# Patient Record
Sex: Male | Born: 1975
Health system: Southern US, Community
[De-identification: ages and names within clinical notes are randomized; demographics above are authoritative.]

## PROBLEM LIST (undated history)

## (undated) DIAGNOSIS — I499 Cardiac arrhythmia, unspecified: Secondary | ICD-10-CM

## (undated) DIAGNOSIS — M303 Mucocutaneous lymph node syndrome [Kawasaki]: Secondary | ICD-10-CM

## (undated) DIAGNOSIS — I10 Essential (primary) hypertension: Secondary | ICD-10-CM

## (undated) HISTORY — DX: Essential (primary) hypertension: I10

## (undated) HISTORY — DX: Mucocutaneous lymph node syndrome (kawasaki): M30.3

---

## 2000-01-27 ENCOUNTER — Emergency Department (HOSPITAL_COMMUNITY): Admission: EM | Admit: 2000-01-27 | Discharge: 2000-01-27 | Payer: Self-pay | Admitting: Emergency Medicine

## 2000-03-07 ENCOUNTER — Encounter: Admission: RE | Admit: 2000-03-07 | Discharge: 2000-03-07 | Payer: Self-pay | Admitting: Internal Medicine

## 2000-12-09 ENCOUNTER — Emergency Department (HOSPITAL_COMMUNITY): Admission: EM | Admit: 2000-12-09 | Discharge: 2000-12-09 | Payer: Self-pay | Admitting: Emergency Medicine

## 2001-02-17 ENCOUNTER — Emergency Department (HOSPITAL_COMMUNITY): Admission: EM | Admit: 2001-02-17 | Discharge: 2001-02-18 | Payer: Self-pay | Admitting: Emergency Medicine

## 2001-02-18 ENCOUNTER — Encounter: Payer: Self-pay | Admitting: Emergency Medicine

## 2002-02-17 ENCOUNTER — Emergency Department (HOSPITAL_COMMUNITY): Admission: EM | Admit: 2002-02-17 | Discharge: 2002-02-17 | Payer: Self-pay | Admitting: Emergency Medicine

## 2002-02-17 ENCOUNTER — Encounter: Payer: Self-pay | Admitting: Emergency Medicine

## 2002-07-03 ENCOUNTER — Emergency Department (HOSPITAL_COMMUNITY): Admission: EM | Admit: 2002-07-03 | Discharge: 2002-07-03 | Payer: Self-pay | Admitting: Psychology

## 2002-07-03 ENCOUNTER — Encounter: Payer: Self-pay | Admitting: Emergency Medicine

## 2003-03-25 ENCOUNTER — Emergency Department (HOSPITAL_COMMUNITY): Admission: EM | Admit: 2003-03-25 | Discharge: 2003-03-25 | Payer: Self-pay | Admitting: Emergency Medicine

## 2003-12-23 ENCOUNTER — Inpatient Hospital Stay (HOSPITAL_COMMUNITY): Admission: EM | Admit: 2003-12-23 | Discharge: 2003-12-23 | Payer: Self-pay | Admitting: Emergency Medicine

## 2004-05-12 ENCOUNTER — Emergency Department (HOSPITAL_COMMUNITY): Admission: EM | Admit: 2004-05-12 | Discharge: 2004-05-12 | Payer: Self-pay | Admitting: Emergency Medicine

## 2005-01-03 ENCOUNTER — Emergency Department (HOSPITAL_COMMUNITY): Admission: EM | Admit: 2005-01-03 | Discharge: 2005-01-03 | Payer: Self-pay | Admitting: Emergency Medicine

## 2005-06-10 ENCOUNTER — Emergency Department (HOSPITAL_COMMUNITY): Admission: EM | Admit: 2005-06-10 | Discharge: 2005-06-10 | Payer: Self-pay | Admitting: Emergency Medicine

## 2005-11-05 ENCOUNTER — Emergency Department (HOSPITAL_COMMUNITY): Admission: EM | Admit: 2005-11-05 | Discharge: 2005-11-05 | Payer: Self-pay | Admitting: Emergency Medicine

## 2006-11-10 ENCOUNTER — Emergency Department (HOSPITAL_COMMUNITY): Admission: EM | Admit: 2006-11-10 | Discharge: 2006-11-10 | Payer: Self-pay | Admitting: Emergency Medicine

## 2007-07-01 ENCOUNTER — Emergency Department (HOSPITAL_COMMUNITY): Admission: EM | Admit: 2007-07-01 | Discharge: 2007-07-01 | Payer: Self-pay | Admitting: Emergency Medicine

## 2007-11-15 ENCOUNTER — Emergency Department (HOSPITAL_COMMUNITY): Admission: EM | Admit: 2007-11-15 | Discharge: 2007-11-16 | Payer: Self-pay | Admitting: Psychiatry

## 2008-05-24 ENCOUNTER — Emergency Department (HOSPITAL_COMMUNITY): Admission: EM | Admit: 2008-05-24 | Discharge: 2008-05-24 | Payer: Self-pay | Admitting: Emergency Medicine

## 2008-11-12 ENCOUNTER — Emergency Department (HOSPITAL_COMMUNITY): Admission: EM | Admit: 2008-11-12 | Discharge: 2008-11-12 | Payer: Self-pay | Admitting: Emergency Medicine

## 2009-04-04 ENCOUNTER — Emergency Department (HOSPITAL_COMMUNITY): Admission: EM | Admit: 2009-04-04 | Discharge: 2009-04-04 | Payer: Self-pay | Admitting: Emergency Medicine

## 2009-11-09 ENCOUNTER — Emergency Department (HOSPITAL_COMMUNITY): Admission: EM | Admit: 2009-11-09 | Discharge: 2009-11-09 | Payer: Self-pay | Admitting: Emergency Medicine

## 2010-01-17 ENCOUNTER — Emergency Department (HOSPITAL_COMMUNITY): Admission: EM | Admit: 2010-01-17 | Discharge: 2010-01-17 | Payer: Self-pay | Admitting: Emergency Medicine

## 2010-06-22 LAB — CBC
Hemoglobin: 14.7 g/dL (ref 13.0–17.0)
MCHC: 33.5 g/dL (ref 30.0–36.0)
MCV: 93.8 fL (ref 78.0–100.0)
RDW: 14.1 % (ref 11.5–15.5)

## 2010-06-22 LAB — DIFFERENTIAL
Basophils Absolute: 0.1 10*3/uL (ref 0.0–0.1)
Basophils Relative: 1 % (ref 0–1)
Eosinophils Absolute: 0.3 10*3/uL (ref 0.0–0.7)
Monocytes Absolute: 0.5 10*3/uL (ref 0.1–1.0)
Neutro Abs: 3.8 10*3/uL (ref 1.7–7.7)
Neutrophils Relative %: 50 % (ref 43–77)

## 2010-07-07 ENCOUNTER — Institutional Professional Consult (permissible substitution): Payer: Self-pay | Admitting: Pulmonary Disease

## 2010-08-20 ENCOUNTER — Emergency Department (HOSPITAL_COMMUNITY)
Admission: EM | Admit: 2010-08-20 | Discharge: 2010-08-20 | Disposition: A | Discharge status: Home / Self Care | Payer: BC Managed Care – PPO | Attending: Emergency Medicine | Admitting: Emergency Medicine

## 2010-08-20 ENCOUNTER — Emergency Department (HOSPITAL_COMMUNITY): Payer: BC Managed Care – PPO

## 2010-08-20 DIAGNOSIS — IMO0002 Reserved for concepts with insufficient information to code with codable children: Secondary | ICD-10-CM | POA: Insufficient documentation

## 2010-08-20 DIAGNOSIS — M79609 Pain in unspecified limb: Secondary | ICD-10-CM | POA: Insufficient documentation

## 2010-08-20 DIAGNOSIS — S6990XA Unspecified injury of unspecified wrist, hand and finger(s), initial encounter: Secondary | ICD-10-CM | POA: Insufficient documentation

## 2010-11-17 ENCOUNTER — Emergency Department (HOSPITAL_COMMUNITY)
Admission: EM | Admit: 2010-11-17 | Discharge: 2010-11-17 | Disposition: A | Discharge status: Home / Self Care | Payer: Self-pay | Attending: Emergency Medicine | Admitting: Emergency Medicine

## 2010-11-17 DIAGNOSIS — Z7982 Long term (current) use of aspirin: Secondary | ICD-10-CM | POA: Insufficient documentation

## 2010-11-17 DIAGNOSIS — I499 Cardiac arrhythmia, unspecified: Secondary | ICD-10-CM | POA: Insufficient documentation

## 2010-11-17 DIAGNOSIS — T1590XA Foreign body on external eye, part unspecified, unspecified eye, initial encounter: Secondary | ICD-10-CM | POA: Insufficient documentation

## 2010-11-21 ENCOUNTER — Emergency Department (HOSPITAL_COMMUNITY)
Admission: EM | Admit: 2010-11-21 | Discharge: 2010-11-21 | Disposition: A | Discharge status: Home / Self Care | Payer: Self-pay | Attending: Emergency Medicine | Admitting: Emergency Medicine

## 2010-11-21 DIAGNOSIS — H5789 Other specified disorders of eye and adnexa: Secondary | ICD-10-CM | POA: Insufficient documentation

## 2010-11-21 DIAGNOSIS — H5710 Ocular pain, unspecified eye: Secondary | ICD-10-CM | POA: Insufficient documentation

## 2010-11-27 ENCOUNTER — Emergency Department (HOSPITAL_COMMUNITY)
Admission: EM | Admit: 2010-11-27 | Discharge: 2010-11-27 | Disposition: A | Discharge status: Home / Self Care | Payer: BC Managed Care – PPO | Attending: Emergency Medicine | Admitting: Emergency Medicine

## 2010-11-27 DIAGNOSIS — H5710 Ocular pain, unspecified eye: Secondary | ICD-10-CM | POA: Insufficient documentation

## 2010-12-13 LAB — CBC
MCHC: 33
MCV: 94.4
Platelets: 262
RBC: 4.22

## 2010-12-13 LAB — POCT I-STAT, CHEM 8
Calcium, Ion: 1.22
Chloride: 105
Creatinine, Ser: 1.5
Potassium: 3.7
Sodium: 140
TCO2: 26

## 2010-12-13 LAB — DIFFERENTIAL
Basophils Absolute: 0
Basophils Relative: 0
Eosinophils Absolute: 0.4
Monocytes Relative: 9
Neutro Abs: 3.3
Neutrophils Relative %: 46

## 2010-12-13 LAB — POCT CARDIAC MARKERS
CKMB, poc: 1.7
Troponin i, poc: <0.05

## 2011-06-24 ENCOUNTER — Emergency Department (HOSPITAL_COMMUNITY)
Admission: EM | Admit: 2011-06-24 | Discharge: 2011-06-24 | Disposition: A | Discharge status: Home / Self Care | Payer: Self-pay | Attending: Emergency Medicine | Admitting: Emergency Medicine

## 2011-06-24 ENCOUNTER — Emergency Department (HOSPITAL_COMMUNITY): Payer: Self-pay

## 2011-06-24 ENCOUNTER — Encounter (HOSPITAL_COMMUNITY): Payer: Self-pay | Admitting: Emergency Medicine

## 2011-06-24 DIAGNOSIS — J45909 Unspecified asthma, uncomplicated: Secondary | ICD-10-CM | POA: Insufficient documentation

## 2011-06-24 DIAGNOSIS — X500XXA Overexertion from strenuous movement or load, initial encounter: Secondary | ICD-10-CM | POA: Insufficient documentation

## 2011-06-24 DIAGNOSIS — IMO0002 Reserved for concepts with insufficient information to code with codable children: Secondary | ICD-10-CM | POA: Insufficient documentation

## 2011-06-24 DIAGNOSIS — Y93K1 Activity, walking an animal: Secondary | ICD-10-CM | POA: Insufficient documentation

## 2011-06-24 DIAGNOSIS — S76319A Strain of muscle, fascia and tendon of the posterior muscle group at thigh level, unspecified thigh, initial encounter: Secondary | ICD-10-CM

## 2011-06-24 DIAGNOSIS — M25569 Pain in unspecified knee: Secondary | ICD-10-CM | POA: Insufficient documentation

## 2011-06-24 DIAGNOSIS — I10 Essential (primary) hypertension: Secondary | ICD-10-CM | POA: Insufficient documentation

## 2011-06-24 HISTORY — DX: Cardiac arrhythmia, unspecified: I49.9

## 2011-06-24 LAB — POCT I-STAT, CHEM 8
Calcium, Ion: 1.17 mmol/L (ref 1.12–1.32)
Chloride: 101 mEq/L (ref 96–112)
HCT: 45 % (ref 39.0–52.0)
Potassium: 3.7 mEq/L (ref 3.5–5.1)

## 2011-06-24 MED ORDER — KETOROLAC TROMETHAMINE 30 MG/ML IJ SOLN
30.0000 mg | Freq: Once | INTRAMUSCULAR | Status: AC
  Administered 2011-06-24: 30 mg via INTRAMUSCULAR
  Filled 2011-06-24: qty 1

## 2011-06-24 MED ORDER — AMLODIPINE BESYLATE 5 MG PO TABS
5.0000 mg | ORAL_TABLET | Freq: Once | ORAL | Status: AC
  Administered 2011-06-24: 5 mg via ORAL
  Filled 2011-06-24: qty 1

## 2011-06-24 MED ORDER — AMLODIPINE BESYLATE 10 MG PO TABS: 5.0000 mg | ORAL_TABLET | Freq: Every day | ORAL | Status: DC

## 2011-06-24 MED ORDER — HYDROCODONE-ACETAMINOPHEN 5-325 MG PO TABS: 2.0000 | ORAL_TABLET | ORAL | Status: AC | PRN

## 2011-06-24 NOTE — ED Provider Notes (Signed)
History     CSN: [id number]  Arrival date & time 06/24/11  0103   First MD Initiated Contact with Patient 06/24/11 0242      Chief Complaint  Patient presents with  . Knee Pain    R knee pain     HPI  Hx provided by pt.  Pt is a 36 yo male with hx of asthma who presents with complaints of rt knee injury and pain.  Pt reports taking his dogs out for a walk and coming down the steps on porch and missing a step causing him to twist and strain his rt knee yesterday.  Pt states he felt there was no significant injury and continued with normal activity. This morning pt woke up with increased pains to knee especially to posterior aspect.  Pain radiates some to calf and hamstring area.  Pain is worse with walking and bending.  Pt has taken some Ibuprofen without significant relief.  He denies other aggravating or alleviating factors.  Pain is moderate.  Pt denies any numbness, weakness or tingling in leg or foot.  He denies significant swelling but feels there is some swelling to the knee and leg.    Past Medical History  Diagnosis Date  . Asthma   . Irregular heartbeat     History reviewed. No pertinent past surgical history.  No family history on file.  History  Substance Use Topics  . Smoking status: Former Games developer  . Smokeless tobacco: Not on file  . Alcohol Use: No      Review of Systems  Respiratory: Negative for cough and shortness of breath.   Cardiovascular: Negative for chest pain.  Musculoskeletal: Positive for joint swelling. Negative for back pain.  Neurological: Negative for weakness and numbness.    Allergies  Review of patient's allergies indicates no known allergies.  Home Medications   Current Outpatient Rx  Name Route Sig Dispense Refill  . ASPIRIN EC 81 MG PO TBEC Oral Take 81 mg by mouth daily.      BP 188/112  Pulse 95  Temp(Src) 98.9 F (37.2 C) (Oral)  Resp 18  Ht 6' (1.829 m)  Wt 240 lb (108.863 kg)  BMI 32.55 kg/m2  SpO2 98%  Physical  Exam  Nursing note and vitals reviewed. Constitutional: He is oriented to person, place, and time. He appears well-developed and well-nourished. No distress.  HENT:  Head: Normocephalic and atraumatic.  Cardiovascular: Normal rate and regular rhythm.   No murmur heard. Pulmonary/Chest: Effort normal and breath sounds normal. No respiratory distress. He has no wheezes. He has no rales.  Abdominal: Soft. He exhibits no distension. There is no tenderness.  Musculoskeletal: He exhibits tenderness. He exhibits no edema.       No increased laxity with valgus or varus stress. Negative anterior posterior drawer test. Tenderness to palpation over medial  Neurological: He is alert and oriented to person, place, and time.  Skin: Skin is warm. No erythema.  Psychiatric: He has a normal mood and affect. His behavior is normal.    ED Course  Procedures   Results for orders placed during the hospital encounter of 06/24/11  POCT I-STAT, CHEM 8      Component Value Range   Sodium 138  135 - 145 (mEq/L)   Potassium 3.7  3.5 - 5.1 (mEq/L)   Chloride 101  96 - 112 (mEq/L)   BUN 24 (*) 6 - 23 (mg/dL)   Creatinine, Ser 7.82 (*) 0.50 - 1.35 (mg/dL)  Glucose, Bld 115 (*) 70 - 99 (mg/dL)   Calcium, Ion 1.61  0.96 - 1.32 (mmol/L)   TCO2 26  0 - 100 (mmol/L)   Hemoglobin 15.3  13.0 - 17.0 (g/dL)   HCT 04.5  40.9 - 81.1 (%)      Dg Knee Complete 4 Views Right  06/24/2011  *RADIOLOGY REPORT*  Clinical Data: Fall.  Knee injury and pain.  RIGHT KNEE - COMPLETE 4+ VIEW  Comparison:  None.  Findings:  There is no evidence of fracture, dislocation, or joint effusion.  There is no evidence of arthropathy or other focal bone abnormality.  Soft tissues are unremarkable.  IMPRESSION: Negative.  Original Report Authenticated By: Danae Orleans, M.D.     1. Hamstring muscle strain   2. Knee pain   3. Hypertension       MDM  3:50AM Pt seen and evaluated.  Pt in no acute distress.  Pt discussed with  attending physician.  Pt with HTN will start him on norvasc.  Pt without other concerning symptoms.  BP improved with medication and pain treatment.      Angus Seller, [place] 06/24/11 1711

## 2011-06-24 NOTE — ED Notes (Signed)
R knee pain, twisted early yesterday morning walking down stairs. Pt states worse after waking up in the morning, denies relief from ibuprofen, ice and elevation.

## 2011-06-24 NOTE — ED Notes (Signed)
Pt states while taking dog outside at 3am yesterday, missed step injuring R knee, pain worse this evening with burning sensation from thigh to calf

## 2011-06-24 NOTE — ED Provider Notes (Signed)
Medical screening examination/treatment/procedure(s) were performed by non-physician practitioner and as supervising physician I was immediately available for consultation/collaboration.  Mallori Araque K Mercedes Valeriano-Rasch, MD 06/24/11 2318 

## 2012-07-21 ENCOUNTER — Emergency Department (HOSPITAL_COMMUNITY): Payer: Self-pay

## 2012-07-21 ENCOUNTER — Encounter (HOSPITAL_COMMUNITY): Payer: Self-pay | Admitting: Emergency Medicine

## 2012-07-21 ENCOUNTER — Emergency Department (HOSPITAL_COMMUNITY)
Admission: EM | Admit: 2012-07-21 | Discharge: 2012-07-21 | Disposition: A | Discharge status: Home / Self Care | Payer: Self-pay | Attending: Emergency Medicine | Admitting: Emergency Medicine

## 2012-07-21 DIAGNOSIS — R059 Cough, unspecified: Secondary | ICD-10-CM | POA: Insufficient documentation

## 2012-07-21 DIAGNOSIS — J3489 Other specified disorders of nose and nasal sinuses: Secondary | ICD-10-CM | POA: Insufficient documentation

## 2012-07-21 DIAGNOSIS — Z7982 Long term (current) use of aspirin: Secondary | ICD-10-CM | POA: Insufficient documentation

## 2012-07-21 DIAGNOSIS — IMO0002 Reserved for concepts with insufficient information to code with codable children: Secondary | ICD-10-CM | POA: Insufficient documentation

## 2012-07-21 DIAGNOSIS — J45901 Unspecified asthma with (acute) exacerbation: Secondary | ICD-10-CM | POA: Insufficient documentation

## 2012-07-21 DIAGNOSIS — J45909 Unspecified asthma, uncomplicated: Secondary | ICD-10-CM

## 2012-07-21 DIAGNOSIS — R6883 Chills (without fever): Secondary | ICD-10-CM | POA: Insufficient documentation

## 2012-07-21 DIAGNOSIS — R05 Cough: Secondary | ICD-10-CM | POA: Insufficient documentation

## 2012-07-21 DIAGNOSIS — Z8679 Personal history of other diseases of the circulatory system: Secondary | ICD-10-CM | POA: Insufficient documentation

## 2012-07-21 DIAGNOSIS — R0789 Other chest pain: Secondary | ICD-10-CM | POA: Insufficient documentation

## 2012-07-21 DIAGNOSIS — J329 Chronic sinusitis, unspecified: Secondary | ICD-10-CM

## 2012-07-21 DIAGNOSIS — Z79899 Other long term (current) drug therapy: Secondary | ICD-10-CM | POA: Insufficient documentation

## 2012-07-21 MED ORDER — FLUTICASONE PROPIONATE 50 MCG/ACT NA SUSP: 2.0000 | Freq: Every day | NASAL | Status: DC

## 2012-07-21 MED ORDER — ALBUTEROL SULFATE (5 MG/ML) 0.5% IN NEBU
5.0000 mg | INHALATION_SOLUTION | Freq: Once | RESPIRATORY_TRACT | Status: AC
  Administered 2012-07-21: 5 mg via RESPIRATORY_TRACT
  Filled 2012-07-21: qty 1

## 2012-07-21 MED ORDER — AZITHROMYCIN 250 MG PO TABS: ORAL_TABLET | ORAL | Status: DC

## 2012-07-21 NOTE — ED Notes (Signed)
Pt c/o congested, productive cough, headache and sore throat x 3 days.

## 2012-07-21 NOTE — Discharge Instructions (Signed)
 Sinusitis Sinusitis is redness, soreness, and swelling (inflammation) of the paranasal sinuses. Paranasal sinuses are air pockets within the bones of your face (beneath the eyes, the middle of the forehead, or above the eyes). In healthy paranasal sinuses, mucus is able to drain out, and air is able to circulate through them by way of your nose. However, when your paranasal sinuses are inflamed, mucus and air can become trapped. This can allow bacteria and other germs to grow and cause infection. Sinusitis can develop quickly and last only a short time (acute) or continue over a long period (chronic). Sinusitis that lasts for more than 12 weeks is considered chronic.  CAUSES  Causes of sinusitis include:  Allergies.  Structural abnormalities, such as displacement of the cartilage that separates your nostrils (deviated septum), which can decrease the air flow through your nose and sinuses and affect sinus drainage.  Functional abnormalities, such as when the small hairs (cilia) that line your sinuses and help remove mucus do not work properly or are not present. SYMPTOMS  Symptoms of acute and chronic sinusitis are the same. The primary symptoms are pain and pressure around the affected sinuses. Other symptoms include:  Upper toothache.  Earache.  Headache.  Bad breath.  Decreased sense of smell and taste.  A cough, which worsens when you are lying flat.  Fatigue.  Fever.  Thick drainage from your nose, which often is green and may contain pus (purulent).  Swelling and warmth over the affected sinuses. DIAGNOSIS  Your caregiver will perform a physical exam. During the exam, your caregiver may:  Look in your nose for signs of abnormal growths in your nostrils (nasal polyps).  Tap over the affected sinus to check for signs of infection.  View the inside of your sinuses (endoscopy) with a special imaging device with a light attached (endoscope), which is inserted into your  sinuses. If your caregiver suspects that you have chronic sinusitis, one or more of the following tests may be recommended:  Allergy tests.  Nasal culture A sample of mucus is taken from your nose and sent to a lab and screened for bacteria.  Nasal cytology A sample of mucus is taken from your nose and examined by your caregiver to determine if your sinusitis is related to an allergy. TREATMENT  Most cases of acute sinusitis are related to a viral infection and will resolve on their own within 10 days. Sometimes medicines are prescribed to help relieve symptoms (pain medicine, decongestants, nasal steroid sprays, or saline sprays).  However, for sinusitis related to a bacterial infection, your caregiver will prescribe antibiotic medicines. These are medicines that will help kill the bacteria causing the infection.  Rarely, sinusitis is caused by a fungal infection. In theses cases, your caregiver will prescribe antifungal medicine. For some cases of chronic sinusitis, surgery is needed. Generally, these are cases in which sinusitis recurs more than 3 times per year, despite other treatments. HOME CARE INSTRUCTIONS   Drink plenty of water. Water helps thin the mucus so your sinuses can drain more easily.  Use a humidifier.  Inhale steam 3 to 4 times a day (for example, sit in the bathroom with the shower running).  Apply a warm, moist washcloth to your face 3 to 4 times a day, or as directed by your caregiver.  Use saline nasal sprays to help moisten and clean your sinuses.  Take over-the-counter or prescription medicines for pain, discomfort, or fever only as directed by your caregiver. SEEK IMMEDIATE MEDICAL  CARE IF:  You have increasing pain or severe headaches.  You have nausea, vomiting, or drowsiness.  You have swelling around your face.  You have vision problems.  You have a stiff neck.  You have difficulty breathing. MAKE SURE YOU:   Understand these  instructions.  Will watch your condition.  Will get help right away if you are not doing well or get worse. Document Released: 02/26/2005 Document Revised: 05/21/2011 Document Reviewed: 03/13/2011 [facility] Patient Information 2013 [place], [place].    Asthma, Adult Asthma is caused by narrowing of the air passages in the lungs. It may be triggered by pollen, dust, animal dander, molds, some foods, respiratory infections, exposure to smoke, exercise, emotional stress or other allergens (things that cause allergic reactions or allergies). Repeat attacks are common. HOME CARE INSTRUCTIONS   Use prescription medications as ordered by your caregiver.  Avoid pollen, dust, animal dander, molds, smoke and other things that cause attacks at home and at work.  You may have fewer attacks if you decrease dust in your home. Electrostatic air cleaners may help.  It may help to replace your pillows or mattress with materials less likely to cause allergies.  Talk to your caregiver about an action plan for managing asthma attacks at home, including, the use of a peak flow meter which measures the severity of your asthma attack. An action plan can help minimize or stop the attack without having to seek medical care.  If you are not on a fluid restriction, drink 8 to 10 glasses of water each day.  Always have a plan prepared for seeking medical attention, including, calling your physician, accessing local emergency care, and calling 911 (in the [place] for a severe attack.  Discuss possible exercise routines with your caregiver.  If animal dander is the cause of asthma, you may need to get rid of pets. SEEK MEDICAL CARE IF:   You have wheezing and shortness of breath even if taking medicine to prevent attacks.  You have muscle aches, chest pain or thickening of sputum.  Your sputum changes from clear or white to yellow, green, gray, or bloody.  You have any problems that may be related to the  medicine you are taking (such as a rash, itching, swelling or trouble breathing). SEEK IMMEDIATE MEDICAL CARE IF:   Your usual medicines do not stop your wheezing or there is increased coughing and/or shortness of breath.  You have increased difficulty breathing.  You have a fever. MAKE SURE YOU:   Understand these instructions.  Will watch your condition.  Will get help right away if you are not doing well or get worse. Document Released: 02/26/2005 Document Revised: 05/21/2011 Document Reviewed: 10/15/2007 [facility] Patient Information 2013 [place], [place].

## 2012-07-21 NOTE — ED Notes (Signed)
Pt complains of shortness of breath and states "my asthma is acting up"

## 2012-07-21 NOTE — ED Notes (Signed)
Patient is alert and oriented x3.  He was given DC instructions and follow up visit instructions.  Patient gave verbal understanding.  He was DC ambulatory under his own power to home.  V/S stable.  He was not showing any signs of distress on DC 

## 2012-07-22 ENCOUNTER — Encounter (HOSPITAL_COMMUNITY): Payer: Self-pay | Admitting: Emergency Medicine

## 2012-07-22 NOTE — ED Provider Notes (Signed)
History     CSN: [id number]  Arrival date & time 07/21/12  1727   First MD Initiated Contact with Patient 07/21/12 1835      Chief Complaint  Patient presents with  . Shortness of Breath    (Consider location/radiation/quality/duration/timing/severity/associated sxs/prior treatment) HPI Comments: Pt with h/o asthma, has an inhlaer, used twice today with no sig relief.  He has also had nasal congestion, mild sinus pressure for a few days.  He is unsure of any fever, but has had chills the past few days.  Mild dry cough, no sig sore throat.  No difficulty speaking, swallowing.  No skin rash.  No focal numbness or weakness, no stiff neck.  Pt does sometimes get allergies severely in spring time.  Pt does not currently smoke.  No obvious sick contacts.  Has mild chest tightness which feels like asthma to him.  No unusual sweats, N/V.    Patient is a 37 y.o. male presenting with shortness of breath. The history is provided by the patient.  Shortness of Breath Associated symptoms: no diaphoresis, no headaches, no rash and no vomiting     Past Medical History  Diagnosis Date  . Asthma   . Irregular heartbeat     History reviewed. No pertinent past surgical history.  History reviewed. No pertinent family history.  History  Substance Use Topics  . Smoking status: Former Games developer  . Smokeless tobacco: Not on file  . Alcohol Use: No      Review of Systems  Constitutional: Positive for chills. Negative for diaphoresis.  HENT: Positive for congestion, rhinorrhea and sinus pressure.   Eyes: Negative for visual disturbance.  Respiratory: Positive for shortness of breath.   Gastrointestinal: Negative for nausea and vomiting.  Skin: Negative for rash.  Allergic/Immunologic: Positive for environmental allergies.  Neurological: Negative for headaches.  All other systems reviewed and are negative.    Allergies  Review of patient's allergies indicates no known allergies.  Home  Medications   Current Outpatient Rx  Name  Route  Sig  Dispense  Refill  . albuterol (PROVENTIL HFA;VENTOLIN HFA) 108 (90 BASE) MCG/ACT inhaler   Inhalation   Inhale 2 puffs into the lungs every 6 (six) hours as needed for wheezing.         Marland Kitchen EXPIRED: amLODipine (NORVASC) 10 MG tablet   Oral   Take 0.5 tablets (5 mg total) by mouth daily.   60 tablet   0   . aspirin EC 81 MG tablet   Oral   Take 81 mg by mouth daily.         Marland Kitchen azithromycin (ZITHROMAX) 250 MG tablet      2 tablets by mouth on first day, then 1 tablet by mouth daily on days 2-5   6 tablet   0   . fluticasone (FLONASE) 50 MCG/ACT nasal spray   Nasal   Place 2 sprays into the nose daily.   16 g   0     BP 147/92  Pulse 103  Temp(Src) 99.8 F (37.7 C) (Oral)  Resp 20  SpO2 96%  Physical Exam  Nursing note and vitals reviewed. Constitutional: He appears well-developed and well-nourished. No distress.  HENT:  Head: Normocephalic and atraumatic.  Nose: Mucosal edema, rhinorrhea and sinus tenderness present. No epistaxis. Right sinus exhibits frontal sinus tenderness. Right sinus exhibits no maxillary sinus tenderness. Left sinus exhibits frontal sinus tenderness. Left sinus exhibits no maxillary sinus tenderness.  Eyes: EOM are normal. No scleral  icterus.  Neck: Neck supple. No tracheal deviation present.  Pulmonary/Chest: No accessory muscle usage or stridor. Not tachypneic. No respiratory distress. He has no decreased breath sounds. He has no wheezes. He has no rhonchi. He has no rales.  Lymphadenopathy:       Right cervical: No superficial cervical adenopathy present.      Left cervical: No superficial cervical adenopathy present.  Skin: He is not diaphoretic.    ED Course  Procedures (including critical care time)  Labs Reviewed - No data to display Dg Chest 2 View (if Patient Has Fever And/or Copd)  07/21/2012  *RADIOLOGY REPORT*  Clinical Data: Cough, shortness of breath, fever.  CHEST - 2  VIEW  Comparison: 11/09/2009  Findings: Heart and mediastinal contours are within normal limits. No focal opacities or effusions.  No acute bony abnormality.  IMPRESSION: No active cardiopulmonary disease.   Original Report Authenticated By: Charlett Nose, M.D.      1. Sinusitis   2. Asthma     ra sat is 96% and I interpret to be normal   MDM  Pt with mild chest tightness, no overt wheezing, some improvement with neb treatment here.  I reviewed CXR 2 view which shows no infiltrates, PTX, free air.  Pt with obvious nasal congestion, mild sinus pressure. Allergies and possible bacterial sinusitis I think is causing more of his symptoms, given chills, tenderness on sinuses, will treat with Z pak.  Pt told to continue his inhaler otherwise.          Gavin Pound. Analee Montee, MD 07/22/12 1436

## 2013-10-16 ENCOUNTER — Emergency Department: Payer: Self-pay | Admitting: Emergency Medicine

## 2013-10-16 LAB — BASIC METABOLIC PANEL
Anion Gap: 4 — ABNORMAL LOW (ref 7–16)
BUN: 13 mg/dL (ref 7–18)
CO2: 31 mmol/L (ref 21–32)
Calcium, Total: 8.9 mg/dL (ref 8.5–10.1)
Chloride: 104 mmol/L (ref 98–107)
Creatinine: 1.29 mg/dL (ref 0.60–1.30)
EGFR (African American): >60
EGFR (Non-African Amer.): >60
Glucose: 99 mg/dL (ref 65–99)
Osmolality: 278 (ref 275–301)
Potassium: 4.3 mmol/L (ref 3.5–5.1)
SODIUM: 139 mmol/L (ref 136–145)

## 2013-10-16 LAB — CBC
HCT: 43.9 % (ref 40.0–52.0)
HGB: 14.6 g/dL (ref 13.0–18.0)
MCH: 31.6 pg (ref 26.0–34.0)
MCHC: 33.3 g/dL (ref 32.0–36.0)
MCV: 95 fL (ref 80–100)
Platelet: 241 10*3/uL (ref 150–440)
RBC: 4.62 10*6/uL (ref 4.40–5.90)
RDW: 14.9 % — ABNORMAL HIGH (ref 11.5–14.5)
WBC: 6.8 10*3/uL (ref 3.8–10.6)

## 2013-10-16 LAB — CK TOTAL AND CKMB (NOT AT ARMC)
CK, Total: 155 U/L
CK-MB: 1.3 ng/mL (ref 0.5–3.6)

## 2013-10-16 LAB — TROPONIN I: Troponin-I: <0.02 ng/mL

## 2013-10-18 ENCOUNTER — Emergency Department: Payer: Self-pay | Admitting: Emergency Medicine

## 2014-03-25 ENCOUNTER — Telehealth: Payer: Self-pay | Admitting: *Deleted

## 2014-03-25 ENCOUNTER — Emergency Department (HOSPITAL_COMMUNITY): Payer: No Typology Code available for payment source

## 2014-03-25 ENCOUNTER — Emergency Department (HOSPITAL_COMMUNITY)
Admission: EM | Admit: 2014-03-25 | Discharge: 2014-03-25 | Disposition: A | Discharge status: Home / Self Care | Payer: Self-pay | Attending: Emergency Medicine | Admitting: Emergency Medicine

## 2014-03-25 ENCOUNTER — Encounter (HOSPITAL_COMMUNITY): Payer: Self-pay | Admitting: Emergency Medicine

## 2014-03-25 DIAGNOSIS — Z7951 Long term (current) use of inhaled steroids: Secondary | ICD-10-CM | POA: Insufficient documentation

## 2014-03-25 DIAGNOSIS — J45909 Unspecified asthma, uncomplicated: Secondary | ICD-10-CM

## 2014-03-25 DIAGNOSIS — J069 Acute upper respiratory infection, unspecified: Secondary | ICD-10-CM | POA: Insufficient documentation

## 2014-03-25 DIAGNOSIS — Z87891 Personal history of nicotine dependence: Secondary | ICD-10-CM | POA: Insufficient documentation

## 2014-03-25 DIAGNOSIS — Z79899 Other long term (current) drug therapy: Secondary | ICD-10-CM | POA: Insufficient documentation

## 2014-03-25 DIAGNOSIS — J45901 Unspecified asthma with (acute) exacerbation: Secondary | ICD-10-CM | POA: Insufficient documentation

## 2014-03-25 DIAGNOSIS — Z7982 Long term (current) use of aspirin: Secondary | ICD-10-CM | POA: Insufficient documentation

## 2014-03-25 DIAGNOSIS — I10 Essential (primary) hypertension: Secondary | ICD-10-CM | POA: Insufficient documentation

## 2014-03-25 MED ORDER — IPRATROPIUM-ALBUTEROL 0.5-2.5 (3) MG/3ML IN SOLN
RESPIRATORY_TRACT | Status: AC
  Filled 2014-03-25: qty 3

## 2014-03-25 MED ORDER — IPRATROPIUM-ALBUTEROL 0.5-2.5 (3) MG/3ML IN SOLN
3.0000 mL | Freq: Once | RESPIRATORY_TRACT | Status: AC
  Administered 2014-03-25: 3 mL via RESPIRATORY_TRACT

## 2014-03-25 MED ORDER — ALBUTEROL SULFATE HFA 108 (90 BASE) MCG/ACT IN AERS: 2.0000 | INHALATION_SPRAY | RESPIRATORY_TRACT | Status: DC | PRN

## 2014-03-25 MED ORDER — PREDNISONE 20 MG PO TABS
50.0000 mg | ORAL_TABLET | Freq: Once | ORAL | Status: AC
  Administered 2014-03-25: 50 mg via ORAL
  Filled 2014-03-25: qty 3

## 2014-03-25 MED ORDER — IPRATROPIUM-ALBUTEROL 0.5-2.5 (3) MG/3ML IN SOLN
3.0000 mL | Freq: Once | RESPIRATORY_TRACT | Status: AC
Start: 2014-03-25 — End: 2014-03-25
  Administered 2014-03-25: 3 mL via RESPIRATORY_TRACT
  Filled 2014-03-25: qty 3

## 2014-03-25 MED ORDER — CLONIDINE HCL 0.2 MG PO TABS
0.2000 mg | ORAL_TABLET | Freq: Once | ORAL | Status: AC
  Administered 2014-03-25: 0.2 mg via ORAL
  Filled 2014-03-25: qty 1

## 2014-03-25 NOTE — ED Provider Notes (Signed)
CSN: [id number]     Arrival date & time 03/25/14  1300 History   First MD Initiated Contact with Patient 03/25/14 1701     Chief Complaint  Patient presents with  . Asthma  . URI     (Consider location/radiation/quality/duration/timing/severity/associated sxs/prior Treatment) HPI  Justin Liu is a 39 y.o. male with PMH of asthma presenting with 3 days of URI systems including wheezing and cough productive of thin clear mucus. He also reports low-grade fevers. Patient has been taking over-the-counter cold medications with mild relief. Patient with history of asthma but has not had use an inhaler for 6 years since he stopped smoking. He is never had to be hospitalized for asthma. He cannot remember the last time he required steroids. Patient denies sick contacts. No chest pain. Patient with mild headache that developed gradually has been ongoing for the past 3 days as well. No visual changes, slurred speech, weakness. Patient denies history of hypertension, diabetes.   Past Medical History  Diagnosis Date  . Asthma   . Irregular heartbeat    History reviewed. No pertinent past surgical history. History reviewed. No pertinent family history. History  Substance Use Topics  . Smoking status: Former Games developermoker  . Smokeless tobacco: Not on file  . Alcohol Use: No    Review of Systems 10 Systems reviewed and are negative for acute change except as noted in the HPI.    Allergies  Review of patient's allergies indicates no known allergies.  Home Medications   Prior to Admission medications   Medication Sig Start Date End Date Taking? Authorizing Provider  albuterol (PROVENTIL HFA;VENTOLIN HFA) 108 (90 BASE) MCG/ACT inhaler Inhale 2 puffs into the lungs every 4 (four) hours as needed for wheezing or shortness of breath. 03/25/14   Louann SjogrenVictoria L Tocarra Gassen, PA-C  amLODipine (NORVASC) 10 MG tablet Take 0.5 tablets (5 mg total) by mouth daily. 06/24/11 07/21/12  Angus SellerPeter S Dammen, PA-C  aspirin  EC 81 MG tablet Take 81 mg by mouth daily.   Yes Historical Provider, MD  fluticasone (FLONASE) 50 MCG/ACT nasal spray Place 2 sprays into the nose daily. 07/21/12 08/03/12  Gavin PoundMichael Y. Ghim, MD  Pseudoephedrine-APAP-DM (DAYQUIL MULTI-SYMPTOM COLD/FLU PO) Take 1 capsule by mouth daily as needed (cold symptoms).   Yes Historical Provider, MD   BP 143/109 mmHg  Pulse 82  Temp(Src) 98 F (36.7 C) (Oral)  Resp 16  Ht 6' (1.829 m)  Wt 240 lb (108.863 kg)  BMI 32.54 kg/m2  SpO2 95% Physical Exam  Constitutional: He appears well-developed and well-nourished. No distress.  HENT:  Head: Normocephalic and atraumatic.  Nose: Right sinus exhibits no maxillary sinus tenderness and no frontal sinus tenderness. Left sinus exhibits no maxillary sinus tenderness and no frontal sinus tenderness.  Mouth/Throat: Mucous membranes are normal. Posterior oropharyngeal edema and posterior oropharyngeal erythema present. No oropharyngeal exudate.  Dry mucous membranes  Eyes: Conjunctivae and EOM are normal. Right eye exhibits no discharge. Left eye exhibits no discharge.  Neck: Normal range of motion. Neck supple.  Cardiovascular: Normal rate, regular rhythm and normal heart sounds.   Pulmonary/Chest: Effort normal and breath sounds normal. No respiratory distress. He has no rales.  Diffuse expiratory wheezing  Abdominal: Soft. Bowel sounds are normal. He exhibits no distension. There is no tenderness.  Lymphadenopathy:    He has cervical adenopathy.  Neurological: He is alert.  Skin: Skin is warm and dry. He is not diaphoretic.  Nursing note and vitals reviewed.   ED Course  Procedures (including critical care time) Labs Review Labs Reviewed - No data to display  Imaging Review Dg Chest 2 View (if Patient Has Fever And/or Copd)  03/25/2014   CLINICAL DATA:  Asthma, upper respiratory infection  EXAM: CHEST  2 VIEW  COMPARISON:  10/18/2013  FINDINGS: Cardiomediastinal silhouette is stable. No acute  infiltrate or pleural effusion. No pulmonary edema. Bony thorax is unremarkable.  IMPRESSION: No active cardiopulmonary disease.   Electronically Signed   By: Natasha Mead M.D.   On: 03/25/2014 14:38     EKG Interpretation   Date/Time:  Thursday March 25 2014 17:03:38 EST Ventricular Rate:  86 PR Interval:  157 QRS Duration: 90 QT Interval:  363 QTC Calculation: 434 R Axis:   20 Text Interpretation:  Sinus rhythm Probable left atrial enlargement  Probable left ventricular hypertrophy Anterior Q waves, possibly due to  LVH Nonspecific T abnormalities, lateral leads Baseline wander in lead(s)  II III aVF No significant change since last tracing Confirmed by Mirian Mo [phone]) on 03/25/2014 11:54:19 PM      MDM   Final diagnoses:  Asthma, unspecified asthma severity, uncomplicated  URI (upper respiratory infection)  Essential hypertension   Patient ambulated in ED with O2 saturations maintained >90, no current signs of respiratory distress. Lung exam improved after nebulizer treatment. Prednisone given in the ED and pt will bd dc with 5 day burst.  Pt states they are breathing at baseline. Pt has been instructed to continue using prescribed medications. Prednisone script not printed at time of discharge. Flow manager called in medication and left message for patient to pick up the prescription. Patient without a PCP. Patient to establish care and follow up. ED resources provided. Pt appears reliable for follow up and is agreeable to discharge.   Patient noted to be hypertensive in the emergency department.  No signs of hypertensive urgency.  Discussed with patient the need for close follow-up and management by a primary care physician. Stressed the importance of following up and patient verbalizes understanding and agreement.  Discussed return precautions with patient. Discussed all results and patient verbalizes understanding and agrees with plan.    Louann Sjogren,  PA-C 03/26/14 6045  Mirian Mo, MD 03/26/14 [phone]

## 2014-03-25 NOTE — ED Notes (Signed)
Pt c/o increased shortness of breath associated with wheezing. One duoneb given prior to assessment; bilateral mild wheezing noted.

## 2014-03-25 NOTE — Discharge Instructions (Signed)
Return to the emergency room with worsening of symptoms, new symptoms or with symptoms that are concerning , especially fevers, stiff neck, worsening headache, nausea/vomiting, visual changes or slurred speech, chest pain, shortness of breath, cough with thick colored mucous or blood Drink plenty of fluids with electrolytes especially Gatorade. OTC cold medications such as mucinex, nyquil, dayquil are recommended. Chloraseptic for sore throat.  Please call your doctor for a followup appointment within 24-48 hours. When you talk to your doctor please let them know that you were seen in the emergency department and have them acquire all of your records so that they can discuss the findings with you and formulate a treatment plan to fully care for your new and ongoing problems. If you do not have a primary care provider please call the number below under ED resources to establish care with a provider and follow up.    Asthma, Acute Bronchospasm Acute bronchospasm caused by asthma is also referred to as an asthma attack. Bronchospasm means your air passages become narrowed. The narrowing is caused by inflammation and tightening of the muscles in the air tubes (bronchi) in your lungs. This can make it hard to breathe or cause you to wheeze and cough. CAUSES Possible triggers are:  Animal dander from the skin, hair, or feathers of animals.  Dust mites contained in house dust.  Cockroaches.  Pollen from trees or grass.  Mold.  Cigarette or tobacco smoke.  Air pollutants such as dust, household cleaners, hair sprays, aerosol sprays, paint fumes, strong chemicals, or strong odors.  Cold air or weather changes. Cold air may trigger inflammation. Winds increase molds and pollens in the air.  Strong emotions such as crying or laughing hard.  Stress.  Certain medicines such as aspirin or beta-blockers.  Sulfites in foods and drinks, such as dried fruits and wine.  Infections or inflammatory  conditions, such as a flu, cold, or inflammation of the nasal membranes (rhinitis).  Gastroesophageal reflux disease (GERD). GERD is a condition where stomach acid backs up into your esophagus.  Exercise or strenuous activity. SIGNS AND SYMPTOMS   Wheezing.  Excessive coughing, particularly at night.  Chest tightness.  Shortness of breath. DIAGNOSIS  Your health care provider will ask you about your medical history and perform a physical exam. A chest X-ray or blood testing may be performed to look for other causes of your symptoms or other conditions that may have triggered your asthma attack. TREATMENT  Treatment is aimed at reducing inflammation and opening up the airways in your lungs. Most asthma attacks are treated with inhaled medicines. These include quick relief or rescue medicines (such as bronchodilators) and controller medicines (such as inhaled corticosteroids). These medicines are sometimes given through an inhaler or a nebulizer. Systemic steroid medicine taken by mouth or given through an IV tube also can be used to reduce the inflammation when an attack is moderate or severe. Antibiotic medicines are only used if a bacterial infection is present.  HOME CARE INSTRUCTIONS   Rest.  Drink plenty of liquids. This helps the mucus to remain thin and be easily coughed up. Only use caffeine in moderation and do not use alcohol until you have recovered from your illness.  Do not smoke. Avoid being exposed to secondhand smoke.  You play a critical role in keeping yourself in good health. Avoid exposure to things that cause you to wheeze or to have breathing problems.  Keep your medicines up-to-date and available. Carefully follow your health care provider's  treatment plan.  Take your medicine exactly as prescribed.  When pollen or pollution is bad, keep windows closed and use an air conditioner or go to places with air conditioning.  Asthma requires careful medical care. See  your health care provider for a follow-up as advised. If you are more than [redacted] weeks pregnant and you were prescribed any new medicines, let your obstetrician know about the visit and how you are doing. Follow up with your health care provider as directed.  After you have recovered from your asthma attack, make an appointment with your outpatient doctor to talk about ways to reduce the likelihood of future attacks. If you do not have a doctor who manages your asthma, make an appointment with a primary care doctor to discuss your asthma. SEEK IMMEDIATE MEDICAL CARE IF:   You are getting worse.  You have trouble breathing. If severe, call your local emergency services (911 in the U.S.).  You develop chest pain or discomfort.  You are vomiting.  You are not able to keep fluids down.  You are coughing up yellow, green, brown, or bloody sputum.  You have a fever and your symptoms suddenly get worse.  You have trouble swallowing. MAKE SURE YOU:   Understand these instructions.  Will watch your condition.  Will get help right away if you are not doing well or get worse. Document Released: 06/13/2006 Document Revised: 03/03/2013 Document Reviewed: 09/03/2012 [facility] Patient Information 2015 [employer], [place]. This information is not intended to replace advice given to you by your health care provider. Make sure you discuss any questions you have with your health care provider.   Emergency Department Resource Guide 1) Find a Doctor and Pay Out of Pocket Although you won't have to find out who is covered by your insurance plan, it is a good idea to ask around and get recommendations. You will then need to call the office and see if the doctor you have chosen will accept you as a new patient and what types of options they offer for patients who are self-pay. Some doctors offer discounts or will set up payment plans for their patients who do not have insurance, but you will need to ask so you  aren't surprised when you get to your appointment.  2) Contact Your Local Health Department Not all health departments have doctors that can see patients for sick visits, but many do, so it is worth a call to see if yours does. If you don't know where your local health department is, you can check in your phone book. The CDC also has a tool to help you locate your state's health department, and many state websites also have listings of all of their local health departments.  3) Find a Walk-in Clinic If your illness is not likely to be very severe or complicated, you may want to try a walk in clinic. These are popping up all over the country in pharmacies, drugstores, and shopping centers. They're usually staffed by nurse practitioners or physician assistants that have been trained to treat common illnesses and complaints. They're usually fairly quick and inexpensive. However, if you have serious medical issues or chronic medical problems, these are probably not your best option.  No Primary Care Doctor: - Call Health Connect at  [phone] - they can help you locate a primary care doctor that  accepts your insurance, provides certain services, etc. - Physician Referral Service- [phone]  Chronic Pain Problems: Organization  Address  Phone   Notes  [facility]  [phone] Patients need to be referred by their primary care doctor.   Medication Assistance: Organization         Address  Phone   Notes  [facility] Medication [facility] [address] [place] [place], [place] [address] [phone] --Must be a resident of [facility] -- Must have NO insurance coverage whatsoever (no Medicaid/ Medicare, etc.) -- The pt. MUST have a primary care doctor that directs their care regularly and follows them in the community   [facility]ist  [phone]   [employer]  [phone]    Agencies that provide inexpensive medical care: Organization          Address  Phone   Notes  [facility]  [phone]   [facility]    [phone]   [facility] [address] [place], [place] [address] [phone]   [facility] [address], [place] [phone]   [facility]anned Parenthood    [phone]   [facility]    [phone]   [facility]  [address], [place] Phone:  [phone], Fax:  [phone] Hours of Operation:  9 am - 6 pm, M-F.  Also accepts Medicaid/Medicare and self-pay.  [facility]  [address], [place] Phone: [phone], Fax: [phone]. Hours of Operation:  8:30 am - 5:30 pm, M-F.  Also accepts Medicaid and self-pay.  [facility] [place] [address], [place] Phone: [phone]   [facility] [address] Natasha Bence [place], [place] [phone], Ext. [phone] Mondays & Thursdays: 7-9 AM.  First 15 patients are seen on a first come, first serve basis.    Medicaid-accepting [facility] Providers:  Organization         Address  Phone   Notes  [facility] [address], [place] [phone] Also accepts self-pay patients.  [facility] [address] Laurell Josephs [place], [place]  [phone]   [facility] [address], [place] [phone]   [facility] [address], [place] [phone]   Renaye Rakers [address], [place]   [phone] Only accepts [place] Access [place] patients after they have their name applied to their card.   Self-Pay (no insurance) in [facility]:  Organization         Address  Phone   Notes  Sickle Cell Patients, [facility] [address] [place], [place] [phone]   [facility] [address] [place], [place] [phone]    [facility] [place]  [address], [place] [phone]   [facility]/Dr. Osei-Bonsu  [address], [place] or [address], [place] [phone] Phone number for both [place] and [place] locations is the same.  [facility]gent [facility] and [facility] [address], [place] [phone]   [facility] [address] [place] or [address] [phone]   [facility] [address], [place] [phone], phone; [phone], fax Sees patients 1st and 3rd Saturday of every month.  Must not qualify  for public or private insurance (i.e. Medicaid, Medicare, [place] Health Choice, Veterans' Benefits)  Household income should be no more than 200% of the poverty level The clinic cannot treat you if you are pregnant or think you are pregnant  Sexually transmitted diseases are not treated at the clinic.    Dental Care: Organization         Address  Phone  Notes  [facility] [address], [place] [phone] Accepts children up to age 40 who are enrolled in [place] or [place] Health Choice; pregnant women with a Medicaid card; and children who have applied for Medicaid or [place] Health Choice, but were declined, whose parents can pay a reduced fee at time of service.  [facility] [place]  [address], [place] [phone] Accepts children up to age 921 who are enrolled in [place] or [place] Health Choice; pregnant women with a Medicaid card; and children who have applied for Medicaid or  Health Choice, but were declined, whose parents can pay a reduced fee at time of service.  [facility]  [address], [place] [phone] Patients are seen by appointment only. Walk-ins are not accepted. [facility] will see patients 7718  years of age and older. Monday - Tuesday (8am-5pm) Most Wednesdays (8:30-5pm) $30 per visit, cash only  [facility]  [address], [facility] [phone] Patients are seen by appointment only. Walk-ins are not accepted. [facility] will see patients 40 years of age and older. One Wednesday Evening (Monthly: Volunteer Based).  $30 per visit, cash only  [employer] [facility] [facility]  [phone] for adults; Children under age 524, call Graduate Pediatric Dentistry at [phone]. Children aged 64-14, please call [phone] to request a pediatric application.  Dental services are provided in all areas of dental care including fillings, crowns and bridges, complete and partial dentures, implants, gum treatment, root canals, and extractions. Preventive care is also provided. Treatment is provided to both adults and children. Patients are selected via a lottery and there is often a waiting list.   [facility] [address], [place]  [phone] [email or web address]   [facility] [address], [place], [place] [phone], Ext. [phone] Second and Fourth Thursday of each month, opens at 6:30 AM; Clinic ends at 9 AM.  Patients are seen on a first-come first-served basis, and a limited number are seen during each clinic.   [facility]  [address], [place], [place] [phone]   Eligibility Requirements You must have lived in [place], [place], or [place] counties for at least the last three months.   You cannot be eligible for state or federal sponsored National Cityhealthcare insurance, including CIGNAVeterans Administration, [place]icaid, or [employer]Medicare.   You generally cannot be eligible for healthcare insurance through your employer.    How to apply: Eligibility screenings are held every Tuesday and Wednesday afternoon from 1:00 pm until 4:00 pm. You do not need an appointment for the interview!  [facility]  [address], [place], [place] [phone]   [facility]  [phone]   [facility]  [phone]   [facility]  [phone]    Behavioral Health Resources in the Community: Intensive Outpatient Programs Organization         Address  Phone  Notes  [facility] [address] [place], [place] [phone]   [facility] Outpatient [address] [place], [place]   ADS: Alcohol & Drug Svcs [address] [place], [place]   [place] [address]  [place], [place] or [phone]   Substance Abuse Resources Organization         Address  Phone  Notes  Alcohol and Drug Services  [phone]   [place]  [phone]   [place]   Chinita Pester  [phone]   Residential & Outpatient Substance Abuse Program  [phone]   Psychological Services Organization         Address  Phone  Notes  [facility] [place]  [place]  [phone]   [place] [address] [place] or [phone]    Mobile Crisis Teams Organization         Address  Phone  Notes  [facility], Mobile Crisis Care Unit  [phone]   [facility]  [address] [place], [place]   [place] [address] [place] [phone]    Self-Help/Support Groups Organization         Address  Phone             Notes  [place]. of [place] - variety of support groups  Worth Call for more information  Narcotics Anonymous (NA), [facility] [address], [employer] [place]  2 meetings at this location   Special educational needs teacher         Address  Phone  Notes  [facility] [place],    [place]   [phone]   [facility]  [address] [place] [phone], [place], [place]   [place], [place] [phone] Admissions: 8am-3pm M-F  Incentives Substance [place] [address],    [place], [place] [phone]   [facility] [address] [place], [place], [place]   [facility] [address]  [place], [place]   [facility] - Intensive Outpatient [address] Kristeen Mans [address], [place], [place]   [facility] ([place] [place].,  [place], [place] [phone] or [phone]   [facility] (RTS) [address] [place], [place] Accepts Medicaid  [facility] [place] [address]  [place] [phone] Substance Abuse/Addiction Treatment   [facility] Organization         Address  Phone  Notes  [facility]  [phone]   Domenic Schwab, PhD [address] Arlis [place], [place]   [phone] or [phone]   [place]   [address] [place], [place] [phone]   [facility] [address] [place], [place] [phone] Insurance/Medicaid/sponsorship through [facility] Families [address], [place]                                    [place], [place] [phone] [place] [address], [place] [phone]    Dr. Adele Schilder  [phone]   [facility] [place]  [facility]. 1) [address], [place] 2) [place] 3)  [place] [address], [place] [phone]  [phone]   [place] [phone] or [phone] (After Hours)

## 2014-03-25 NOTE — ED Notes (Signed)
Left message for patient to pick up prescription for Prednisone 40 mg i tab qd x 5 days.  [facility] [address], [phone], per [place]

## 2014-03-25 NOTE — ED Notes (Signed)
Pt c/o URI sx with wheezing and some asthma sx; pt sts some fever

## 2014-03-25 NOTE — ED Notes (Signed)
Pt ambulatory with steady gait; oxygen sats maintained at 98% during ambulation. Reports feeling better after two treatments; cough present at this time.

## 2014-04-29 ENCOUNTER — Encounter (HOSPITAL_COMMUNITY): Payer: Self-pay | Admitting: *Deleted

## 2014-04-29 ENCOUNTER — Emergency Department (HOSPITAL_COMMUNITY): Payer: Self-pay

## 2014-04-29 ENCOUNTER — Emergency Department (HOSPITAL_COMMUNITY)
Admission: EM | Admit: 2014-04-29 | Discharge: 2014-04-30 | Disposition: A | Discharge status: Home / Self Care | Payer: Self-pay | Attending: Emergency Medicine | Admitting: Emergency Medicine

## 2014-04-29 DIAGNOSIS — Z87891 Personal history of nicotine dependence: Secondary | ICD-10-CM | POA: Insufficient documentation

## 2014-04-29 DIAGNOSIS — Z7951 Long term (current) use of inhaled steroids: Secondary | ICD-10-CM | POA: Insufficient documentation

## 2014-04-29 DIAGNOSIS — Z79899 Other long term (current) drug therapy: Secondary | ICD-10-CM | POA: Insufficient documentation

## 2014-04-29 DIAGNOSIS — Z8679 Personal history of other diseases of the circulatory system: Secondary | ICD-10-CM | POA: Insufficient documentation

## 2014-04-29 DIAGNOSIS — M25512 Pain in left shoulder: Secondary | ICD-10-CM | POA: Insufficient documentation

## 2014-04-29 DIAGNOSIS — R079 Chest pain, unspecified: Secondary | ICD-10-CM

## 2014-04-29 DIAGNOSIS — J45909 Unspecified asthma, uncomplicated: Secondary | ICD-10-CM | POA: Insufficient documentation

## 2014-04-29 DIAGNOSIS — I10 Essential (primary) hypertension: Secondary | ICD-10-CM

## 2014-04-29 DIAGNOSIS — R0789 Other chest pain: Secondary | ICD-10-CM | POA: Insufficient documentation

## 2014-04-29 DIAGNOSIS — Z7982 Long term (current) use of aspirin: Secondary | ICD-10-CM | POA: Insufficient documentation

## 2014-04-29 LAB — CBC
HCT: 42.4 % (ref 39.0–52.0)
HEMOGLOBIN: 14.1 g/dL (ref 13.0–17.0)
MCH: 30.3 pg (ref 26.0–34.0)
MCHC: 33.3 g/dL (ref 30.0–36.0)
MCV: 91.2 fL (ref 78.0–100.0)
Platelets: 242 10*3/uL (ref 150–400)
RBC: 4.65 MIL/uL (ref 4.22–5.81)
RDW: 15.1 % (ref 11.5–15.5)
WBC: 6.9 10*3/uL (ref 4.0–10.5)

## 2014-04-29 LAB — COMPREHENSIVE METABOLIC PANEL
ALBUMIN: 3.8 g/dL (ref 3.5–5.2)
ALK PHOS: 51 U/L (ref 39–117)
ALT: 30 U/L (ref 0–53)
AST: 22 U/L (ref 0–37)
Anion gap: 8 (ref 5–15)
BILIRUBIN TOTAL: 0.6 mg/dL (ref 0.3–1.2)
BUN: 16 mg/dL (ref 6–23)
CHLORIDE: 102 mmol/L (ref 96–112)
CO2: 28 mmol/L (ref 19–32)
Calcium: 9.6 mg/dL (ref 8.4–10.5)
Creatinine, Ser: 1.32 mg/dL (ref 0.50–1.35)
GFR calc Af Amer: 77 mL/min — ABNORMAL LOW (ref >90)
GFR, EST NON AFRICAN AMERICAN: 67 mL/min — AB (ref >90)
Glucose, Bld: 104 mg/dL — ABNORMAL HIGH (ref 70–99)
POTASSIUM: 3.8 mmol/L (ref 3.5–5.1)
Sodium: 138 mmol/L (ref 135–145)
Total Protein: 7.3 g/dL (ref 6.0–8.3)

## 2014-04-29 LAB — I-STAT TROPONIN, ED
Troponin i, poc: 0.02 ng/mL (ref 0.00–0.08)
Troponin i, poc: 0.02 ng/mL (ref 0.00–0.08)

## 2014-04-29 MED ORDER — LISINOPRIL 10 MG PO TABS: 10.0000 mg | ORAL_TABLET | Freq: Every day | ORAL | Status: DC

## 2014-04-29 MED ORDER — LISINOPRIL 10 MG PO TABS
10.0000 mg | ORAL_TABLET | Freq: Once | ORAL | Status: AC
  Administered 2014-04-30: 10 mg via ORAL
  Filled 2014-04-29: qty 1

## 2014-04-29 MED ORDER — CLONIDINE HCL 0.1 MG PO TABS
0.1000 mg | ORAL_TABLET | Freq: Once | ORAL | Status: AC
Start: 2014-04-30 — End: 2014-04-30
  Administered 2014-04-30: 0.1 mg via ORAL
  Filled 2014-04-29: qty 1

## 2014-04-29 NOTE — ED Notes (Signed)
Pt monitored by pulse ox, bp cuff, and 5-lead. 

## 2014-04-29 NOTE — ED Notes (Signed)
Pt with L chest pain last night.  Pt now experiencing L arm pressure and states he can hear his pulse in his L ear.  Pt denies nausea or sob at this time.

## 2014-04-29 NOTE — Discharge Instructions (Signed)
Return here as needed.  Follow-up with the cardiologist provided Chest Pain (Nonspecific) It is often hard to give a specific diagnosis for the cause of chest pain. There is always a chance that your pain could be related to something serious, such as a heart attack or a blood clot in the lungs. You need to follow up with your health care provider for further evaluation. CAUSES   Heartburn.  Pneumonia or bronchitis.  Anxiety or stress.  Inflammation around your heart (pericarditis) or lung (pleuritis or pleurisy).  A blood clot in the lung.  A collapsed lung (pneumothorax). It can develop suddenly on its own (spontaneous pneumothorax) or from trauma to the chest.  Shingles infection (herpes zoster virus). The chest wall is composed of bones, muscles, and cartilage. Any of these can be the source of the pain.  The bones can be bruised by injury.  The muscles or cartilage can be strained by coughing or overwork.  The cartilage can be affected by inflammation and become sore (costochondritis). DIAGNOSIS  Lab tests or other studies may be needed to find the cause of your pain. Your health care provider may have you take a test called an ambulatory electrocardiogram (ECG). An ECG records your heartbeat patterns over a 24-hour period. You may also have other tests, such as:  Transthoracic echocardiogram (TTE). During echocardiography, sound waves are used to evaluate how blood flows through your heart.  Transesophageal echocardiogram (TEE).  Cardiac monitoring. This allows your health care provider to monitor your heart rate and rhythm in real time.  Holter monitor. This is a portable device that records your heartbeat and can help diagnose heart arrhythmias. It allows your health care provider to track your heart activity for several days, if needed.  Stress tests by exercise or by giving medicine that makes the heart beat faster. TREATMENT   Treatment depends on what may be causing  your chest pain. Treatment may include:  Acid blockers for heartburn.  Anti-inflammatory medicine.  Pain medicine for inflammatory conditions.  Antibiotics if an infection is present.  You may be advised to change lifestyle habits. This includes stopping smoking and avoiding alcohol, caffeine, and chocolate.  You may be advised to keep your head raised (elevated) when sleeping. This reduces the chance of acid going backward from your stomach into your esophagus. Most of the time, nonspecific chest pain will improve within 2-3 days with rest and mild pain medicine.  HOME CARE INSTRUCTIONS   If antibiotics were prescribed, take them as directed. Finish them even if you start to feel better.  For the next few days, avoid physical activities that bring on chest pain. Continue physical activities as directed.  Do not use any tobacco products, including cigarettes, chewing tobacco, or electronic cigarettes.  Avoid drinking alcohol.  Only take medicine as directed by your health care provider.  Follow your health care provider's suggestions for further testing if your chest pain does not go away.  Keep any follow-up appointments you made. If you do not go to an appointment, you could develop lasting (chronic) problems with pain. If there is any problem keeping an appointment, call to reschedule. SEEK MEDICAL CARE IF:   Your chest pain does not go away, even after treatment.  You have a rash with blisters on your chest.  You have a fever. SEEK IMMEDIATE MEDICAL CARE IF:   You have increased chest pain or pain that spreads to your arm, neck, jaw, back, or abdomen.  You have shortness of  breath.  You have an increasing cough, or you cough up blood.  You have severe back or abdominal pain.  You feel nauseous or vomit.  You have severe weakness.  You faint.  You have chills. This is an emergency. Do not wait to see if the pain will go away. Get medical help at once. Call your  local emergency services (911 in [place] Do not drive yourself to the hospital. MAKE SURE YOU:   Understand these instructions.  Will watch your condition.  Will get help right away if you are not doing well or get worse. Document Released: 12/06/2004 Document Revised: 03/03/2013 Document Reviewed: 10/02/2007 [facility] Patient Information 2015 [place], [place]. This information is not intended to replace advice given to you by your health care provider. Make sure you discuss any questions you have with your health care provider. DASH Eating Plan DASH stands for "Dietary Approaches to Stop Hypertension." The DASH eating plan is a healthy eating plan that has been shown to reduce high blood pressure (hypertension). Additional health benefits may include reducing the risk of type 2 diabetes mellitus, heart disease, and stroke. The DASH eating plan may also help with weight loss. WHAT DO I NEED TO KNOW ABOUT THE DASH EATING PLAN? For the DASH eating plan, you will follow these general guidelines:  Choose foods with a percent daily value for sodium of less than 5% (as listed on the food label).  Use salt-free seasonings or herbs instead of table salt or sea salt.  Check with your health care provider or pharmacist before using salt substitutes.  Eat lower-sodium products, often labeled as "lower sodium" or "no salt added."  Eat fresh foods.  Eat more vegetables, fruits, and low-fat dairy products.  Choose whole grains. Look for the word "whole" as the first word in the ingredient list.  Choose fish and skinless chicken or [place] more often than red meat. Limit fish, poultry, and meat to 6 oz (170 g) each day.  Limit sweets, desserts, sugars, and sugary drinks.  Choose heart-healthy fats.  Limit cheese to 1 oz (28 g) per day.  Eat more home-cooked food and less restaurant, buffet, and fast food.  Limit fried foods.  Cook foods using methods other than frying.  Limit canned  vegetables. If you do use them, rinse them well to decrease the sodium.  When eating at a restaurant, ask that your food be prepared with less salt, or no salt if possible. WHAT FOODS CAN I EAT? Seek help from a dietitian for individual calorie needs. Grains Whole grain or whole wheat bread. Brown rice. Whole grain or whole wheat pasta. Quinoa, bulgur, and whole grain cereals. Low-sodium cereals. Corn or whole wheat flour tortillas. Whole grain cornbread. Whole grain crackers. Low-sodium crackers. Vegetables Fresh or frozen vegetables (raw, steamed, roasted, or grilled). Low-sodium or reduced-sodium tomato and vegetable juices. Low-sodium or reduced-sodium tomato sauce and paste. Low-sodium or reduced-sodium canned vegetables.  Fruits All fresh, canned (in natural juice), or frozen fruits. Meat and Other Protein Products Ground beef (85% or leaner), grass-fed beef, or beef trimmed of fat. Skinless chicken or [place]. Ground chicken or [place]. Pork trimmed of fat. All fish and seafood. Eggs. Dried beans, peas, or lentils. Unsalted nuts and seeds. Unsalted canned beans. Dairy Low-fat dairy products, such as skim or 1% milk, 2% or reduced-fat cheeses, low-fat ricotta or cottage cheese, or plain low-fat yogurt. Low-sodium or reduced-sodium cheeses. Fats and Oils Tub margarines without trans fats. Light or reduced-fat mayonnaise and salad  dressings (reduced sodium). Avocado. Safflower, olive, or canola oils. Natural peanut or almond butter. Other Unsalted popcorn and pretzels. The items listed above may not be a complete list of recommended foods or beverages. Contact your dietitian for more options. WHAT FOODS ARE NOT RECOMMENDED? Grains White bread. White pasta. White rice. Refined cornbread. Bagels and croissants. Crackers that contain trans fat. Vegetables Creamed or fried vegetables. Vegetables in a cheese sauce. Regular canned vegetables. Regular canned tomato sauce and paste. Regular tomato  and vegetable juices. Fruits Dried fruits. Canned fruit in light or heavy syrup. Fruit juice. Meat and Other Protein Products Fatty cuts of meat. Ribs, chicken wings, bacon, sausage, bologna, salami, chitterlings, fatback, hot dogs, bratwurst, and packaged luncheon meats. Salted nuts and seeds. Canned beans with salt. Dairy Whole or 2% milk, cream, half-and-half, and cream cheese. Whole-fat or sweetened yogurt. Full-fat cheeses or blue cheese. Nondairy creamers and whipped toppings. Processed cheese, cheese spreads, or cheese curds. Condiments Onion and garlic salt, seasoned salt, table salt, and sea salt. Canned and packaged gravies. Worcestershire sauce. Tartar sauce. Barbecue sauce. Teriyaki sauce. Soy sauce, including reduced sodium. Steak sauce. Fish sauce. Oyster sauce. Cocktail sauce. Horseradish. Ketchup and mustard. Meat flavorings and tenderizers. Bouillon cubes. Hot sauce. Tabasco sauce. Marinades. Taco seasonings. Relishes. Fats and Oils Butter, stick margarine, lard, shortening, ghee, and bacon fat. Coconut, palm kernel, or palm oils. Regular salad dressings. Other Pickles and olives. Salted popcorn and pretzels. The items listed above may not be a complete list of foods and beverages to avoid. Contact your dietitian for more information. WHERE CAN I FIND MORE INFORMATION? National Heart, Lung, and Blood Institute: [employer]Prom[employer].itwww.nhlbi.nih.gov/health/health-topics/topics/dash/ Document Released: 02/15/2011 Document Revised: 07/13/2013 Document Reviewed: 12/31/2012 [facility] Patient Information 2015 [place], [place]. This information is not intended to replace advice given to you by your health care provider. Make sure you discuss any questions you have with your health care provider. Hypertension Hypertension, commonly called high blood pressure, is when the force of blood pumping through your arteries is too strong. Your arteries are the blood vessels that carry blood from your heart throughout  your body. A blood pressure reading consists of a higher number over a lower number, such as 110/72. The higher number (systolic) is the pressure inside your arteries when your heart pumps. The lower number (diastolic) is the pressure inside your arteries when your heart relaxes. Ideally you want your blood pressure below 120/80. Hypertension forces your heart to work harder to pump blood. Your arteries may become narrow or stiff. Having hypertension puts you at risk for heart disease, stroke, and other problems.  RISK FACTORS Some risk factors for high blood pressure are controllable. Others are not.  Risk factors you cannot control include:   Race. You may be at higher risk if you are African American.  Age. Risk increases with age.  Gender. Men are at higher risk than women before age 40 years. After age 40, women are at higher risk than men. Risk factors you can control include:  Not getting enough exercise or physical activity.  Being overweight.  Getting too much fat, sugar, calories, or salt in your diet.  Drinking too much alcohol. SIGNS AND SYMPTOMS Hypertension does not usually cause signs or symptoms. Extremely high blood pressure (hypertensive crisis) may cause headache, anxiety, shortness of breath, and nosebleed. DIAGNOSIS  To check if you have hypertension, your health care provider will measure your blood pressure while you are seated, with your arm held at the level of your heart.  It should be measured at least twice using the same arm. Certain conditions can cause a difference in blood pressure between your right and left arms. A blood pressure reading that is higher than normal on one occasion does not mean that you need treatment. If one blood pressure reading is high, ask your health care provider about having it checked again. TREATMENT  Treating high blood pressure includes making lifestyle changes and possibly taking medicine. Living a healthy lifestyle can help lower  high blood pressure. You may need to change some of your habits. Lifestyle changes may include:  Following the DASH diet. This diet is high in fruits, vegetables, and whole grains. It is low in salt, red meat, and added sugars.  Getting at least 2 hours of brisk physical activity every week.  Losing weight if necessary.  Not smoking.  Limiting alcoholic beverages.  Learning ways to reduce stress. If lifestyle changes are not enough to get your blood pressure under control, your health care provider may prescribe medicine. You may need to take more than one. Work closely with your health care provider to understand the risks and benefits. HOME CARE INSTRUCTIONS  Have your blood pressure rechecked as directed by your health care provider.   Take medicines only as directed by your health care provider. Follow the directions carefully. Blood pressure medicines must be taken as prescribed. The medicine does not work as well when you skip doses. Skipping doses also puts you at risk for problems.   Do not smoke.   Monitor your blood pressure at home as directed by your health care provider. SEEK MEDICAL CARE IF:   You think you are having a reaction to medicines taken.  You have recurrent headaches or feel dizzy.  You have swelling in your ankles.  You have trouble with your vision. SEEK IMMEDIATE MEDICAL CARE IF:  You develop a severe headache or confusion.  You have unusual weakness, numbness, or feel faint.  You have severe chest or abdominal pain.  You vomit repeatedly.  You have trouble breathing. MAKE SURE YOU:   Understand these instructions.  Will watch your condition.  Will get help right away if you are not doing well or get worse. Document Released: 02/26/2005 Document Revised: 07/13/2013 Document Reviewed: 12/19/2012 [facility] Patient Information 2015 [place], [place]. This information is not intended to replace advice given to you by your health care  provider. Make sure you discuss any questions you have with your health care provider.

## 2014-04-29 NOTE — Consult Note (Signed)
Referring Physician:  Primary Physician: Primary Cardiologist: Reason for Consultation:    HPI: 38yoM with hx of kawasaki, HTN, med-nonadherence, and past smoking, presents with hand pain and 2 minutes of chest discomfort.    [name] states that he has had two days of arm and hand discomfort, primarily worsening with his hand today.  During this time, he also describes an episode of substernal chest discomfort that moved from his left to right side and lasted two minutes.  This pain did not improve with rest or worsen with activity, and which he describes as his typical gas pain.  Trop is negative x 2 in ER.  ECG shows LVH with strain pattern similar to prior ECG in January.  He currently denies chest discomfort, shortness of breath, light headedness.  At baseline he can participate in all typical exertional activities without chest pain or dyspnea.  Review of Systems:     Cardiac Review of Systems: {Y] = yes  = no  Chest Pain [    ]  Resting SOB [   ] Exertional SOB  [  ]  Orthopnea [  ]   Pedal Edema [   ]    Palpitations [  ] Syncope  [  ]   Presyncope [   ]  General Review of Systems: [Y] = yes [  ]=no Constitional: recent weight change [  ]; anorexia [  ]; fatigue [  ]; nausea [  ]; night sweats [  ]; fever [  ]; or chills [  ];                                                                     Eyes : blurred vision [  ]; diplopia [   ]; vision changes [  ];  Amaurosis fugax[  ]; Resp: cough [  ];  wheezing[  ];  hemoptysis[  ];  PND [  ];  GI:  gallstones[  ], vomiting[  ];  dysphagia[  ]; melena[  ];  hematochezia [  ]; heartburn[  ];   GU: kidney stones [  ]; hematuria[  ];   dysuria [  ];  nocturia[  ]; incontinence [  ];             Skin: rash, swelling[  ];, hair loss[  ];  peripheral edema[  ];  or itching[  ]; Musculosketetal: myalgias[  ];  joint swelling[  ];  joint erythema[  ]; Hand pain+  joint pain[  ];  back pain[  ];  Heme/Lymph: bruising[  ];   bleeding[  ];  anemia[  ];  Neuro: TIA[  ];  headaches[  ];  stroke[  ];  vertigo[  ];  seizures[  ];   paresthesias[  ];  difficulty walking[  ];  Psych:depression[  ]; anxiety[  ];  Endocrine: diabetes[  ];  thyroid dysfunction[  ];  Other:  Past Medical History  Diagnosis Date  . Asthma   . Irregular heartbeat   K[place]asaki   (Not in a hospital admission)     Infusions:   No Known Allergies  History   Social History  . Marital Status: Single    Spouse Name: N/A  .  Number of Children: N/A  . Years of Education: N/A   Occupational History  . Not on file.   Social History Main Topics  . Smoking status: Former Smoker    Quit date: 04/29/2008  . Smokeless tobacco: Not on file  . Alcohol Use: No  . Drug Use: No  . Sexual Activity: Not on file   Other Topics Concern  . Not on file   Social History Narrative    No family history on file.  FHx: - Dad with late coronary disease  PHYSICAL EXAM: Filed Vitals:   04/29/14 2340  BP: 161/98  Pulse:   Temp:   Resp:     No intake or output data in the 24 hours ending 04/29/14 2341  General:  Well appearing. No respiratory difficulty HEENT: normal Neck: supple. no JVD. Carotids 2+ bilat; no bruits. No lymphadenopathy or thryomegaly appreciated. Cor: PMI nondisplaced. Regular rate & rhythm. No rubs, gallops or murmurs. Lungs: clear Abdomen: soft, nontender, nondistended. No hepatosplenomegaly. No bruits or masses. Good bowel sounds. Extremities: no cyanosis, clubbing, rash, edema Neuro: alert & oriented x 3, cranial nerves grossly intact. moves all 4 extremities w/o difficulty. Affect pleasant.  ECG:  Results for orders placed or performed during the hospital encounter of 04/29/14 (from the past 24 hour(s))  CBC     Status: None   Collection Time: 04/29/14  4:12 PM  Result Value Ref Range   WBC 6.9 4.0 - 10.5 K/uL   RBC 4.65 4.22 - 5.81 MIL/uL   Hemoglobin 14.1 13.0 - 17.0 g/dL   HCT 16.142.4 09.639.0 - 04.552.0 %    MCV 91.2 78.0 - 100.0 fL   MCH 30.3 26.0 - 34.0 pg   MCHC 33.3 30.0 - 36.0 g/dL   RDW 40.915.1 81.111.5 - 91.415.5 %   Platelets 242 150 - 400 K/uL  Comprehensive metabolic panel     Status: Abnormal   Collection Time: 04/29/14  4:12 PM  Result Value Ref Range   Sodium 138 135 - 145 mmol/L   Potassium 3.8 3.5 - 5.1 mmol/L   Chloride 102 96 - 112 mmol/L   CO2 28 19 - 32 mmol/L   Glucose, Bld 104 (H) 70 - 99 mg/dL   BUN 16 6 - 23 mg/dL   Creatinine, Ser 7.821.32 0.50 - 1.35 mg/dL   Calcium 9.6 8.4 - 95.610.5 mg/dL   Total Protein 7.3 6.0 - 8.3 g/dL   Albumin 3.8 3.5 - 5.2 g/dL   AST 22 0 - 37 U/L   ALT 30 0 - 53 U/L   Alkaline Phosphatase 51 39 - 117 U/L   Total Bilirubin 0.6 0.3 - 1.2 mg/dL   GFR calc non Af Amer 67 (L) >90 mL/min   GFR calc Af Amer 77 (L) >90 mL/min   Anion gap 8 5 - 15  I-stat troponin, ED ([facility])     Status: None   Collection Time: 04/29/14  4:20 PM  Result Value Ref Range   Troponin i, poc 0.02 0.00 - 0.08 ng/mL   Comment 3          I-stat troponin, ED     Status: None   Collection Time: 04/29/14  8:16 PM  Result Value Ref Range   Troponin i, poc 0.02 0.00 - 0.08 ng/mL   Comment 3           Dg Chest 2 View  04/29/2014   CLINICAL DATA:  Acute left side cp with pain running  down left arm x 2 days. Pt has hx of asthma/irregular heartbeat. Past-Smoker.  EXAM: CHEST  2 VIEW  COMPARISON:  03/25/2014  FINDINGS: The heart size and mediastinal contours are within normal limits. Both lungs are clear. No pleural effusion or pneumothorax. The visualized skeletal structures are unremarkable.  IMPRESSION: No active cardiopulmonary disease.   Electronically Signed   By: Amie Portland M.D.   On: 04/29/2014 18:19   ECG: nsr with LVH w lateral ST depressions   ASSESSMENT: 38yoM with atypical chest discomfort, non-cardiac by diamond forrester criteria, but with uncontrolled HTN and LVH on ECG.    PLAN/DISCUSSION: - ROMI - Would recommend outpatient stress test - Patient states he  has been lost to medical follow up due to loss of insurance, if possible would recommend follow up with clinic  Dossie Arbour, MD Cardiology Fellow

## 2014-04-29 NOTE — ED Notes (Signed)
ED PA at bedside

## 2014-04-30 NOTE — ED Provider Notes (Signed)
CSN: [id number]     Arrival date & time 04/29/14  1539 History   First MD Initiated Contact with Patient 04/29/14 1834     Chief Complaint  Patient presents with  . Chest Pain  . Arm Pain     (Consider location/radiation/quality/duration/timing/severity/associated sxs/prior Treatment) HPI Patient presents to the emergency department with chest discomfort that is not ongoing for quite a while, but the patient's also noticed some shoulder discomfort on the right side and left side.  Patient states that he has not seen anyone for this.  Patient states that he has had a history of hypertension and is on medications for this.  He states that several family members have had cardiac related issues.  Patient denies shortness of breath, nausea, vomiting, diarrhea, abdominal pain, cough, fever, runny nose, sore throat, rash, lightheadedness, near syncope or syncope Past Medical History  Diagnosis Date  . Asthma   . Irregular heartbeat    History reviewed. No pertinent past surgical history. No family history on file. History  Substance Use Topics  . Smoking status: Former Smoker    Quit date: 04/29/2008  . Smokeless tobacco: Not on file  . Alcohol Use: No    Review of Systems  All other systems negative except as documented in the HPI. All pertinent positives and negatives as reviewed in the HPI.  Allergies  Review of patient's allergies indicates no known allergies.  Home Medications   Prior to Admission medications   Medication Sig Start Date End Date Taking? Authorizing Provider  albuterol (PROVENTIL HFA;VENTOLIN HFA) 108 (90 BASE) MCG/ACT inhaler Inhale 2 puffs into the lungs every 4 (four) hours as needed for wheezing or shortness of breath. 03/25/14  Yes Louann Sjogren, PA-C  aspirin EC 81 MG tablet Take 81 mg by mouth daily.   Yes Historical Provider, MD  cromolyn (NASALCROM) 5.2 MG/ACT nasal spray Place 1 spray into both nostrils daily.   Yes Historical Provider, MD   amLODipine (NORVASC) 10 MG tablet Take 0.5 tablets (5 mg total) by mouth daily. 06/24/11 07/21/12  Phill Mutter Dammen, PA-C  fluticasone (FLONASE) 50 MCG/ACT nasal spray Place 2 sprays into the nose daily. 07/21/12 08/03/12  Gavin Pound. Ghim, MD  lisinopril (PRINIVIL,ZESTRIL) 10 MG tablet Take 1 tablet (10 mg total) by mouth daily. 04/30/14   Arby Barrette, MD   BP 157/114 mmHg  Pulse 70  Temp(Src) 98.6 F (37 C)  Resp 18  Ht 6' (1.829 m)  Wt 238 lb (107.956 kg)  BMI 32.27 kg/m2  SpO2 100% Physical Exam  Constitutional: He is oriented to person, place, and time. He appears well-developed and well-nourished. No distress.  HENT:  Head: Normocephalic and atraumatic.  Mouth/Throat: Oropharynx is clear and moist.  Eyes: Pupils are equal, round, and reactive to light.  Neck: Normal range of motion. Neck supple.  Cardiovascular: Normal rate, regular rhythm and normal heart sounds.  Exam reveals no gallop and no friction rub.   No murmur heard. Pulmonary/Chest: Effort normal and breath sounds normal. No respiratory distress.  Abdominal: Soft. Bowel sounds are normal. He exhibits no distension. There is no tenderness.  Musculoskeletal: He exhibits no edema.  Neurological: He is alert and oriented to person, place, and time. He exhibits normal muscle tone. Coordination normal.  Skin: Skin is warm and dry. No rash noted. No erythema.  Psychiatric: He has a normal mood and affect. His behavior is normal.  Nursing note and vitals reviewed.   ED Course  Procedures (including critical care  time) Labs Review Labs Reviewed  COMPREHENSIVE METABOLIC PANEL - Abnormal; Notable for the following:    Glucose, Bld 104 (*)    GFR calc non Af Amer 67 (*)    GFR calc Af Amer 77 (*)    All other components within normal limits  CBC  I-STAT TROPOININ, ED  I-STAT TROPOININ, ED    Imaging Review Dg Chest 2 View  04/29/2014   CLINICAL DATA:  Acute left side cp with pain running down left arm x 2 days. Pt has  hx of asthma/irregular heartbeat. Past-Smoker.  EXAM: CHEST  2 VIEW  COMPARISON:  03/25/2014  FINDINGS: The heart size and mediastinal contours are within normal limits. Both lungs are clear. No pleural effusion or pneumothorax. The visualized skeletal structures are unremarkable.  IMPRESSION: No active cardiopulmonary disease.   Electronically Signed   By: Amie Portlandavid  Ormond M.D.   On: 04/29/2014 18:19     EKG Interpretation   Date/Time:  Thursday April 29 2014 20:08:48 EST Ventricular Rate:  69 PR Interval:  181 QRS Duration: 99 QT Interval:  421 QTC Calculation: 451 R Axis:   93 Text Interpretation:  Sinus rhythm Borderline right axis deviation  Probable LVH with secondary repol abnrm Anterior ST elevation, probably  due to LVH agree. increase t wave inversion c/w old Confirmed by Donnald GarrePfeiffer,  MD, Lebron ConnersMarcy [phone] on 04/29/2014 8:15:26 PM     The patient describes a somewhat atypical presentation for cardiac chest pain.  The patient was seen by cardiology here in the emergency department.  They felt that he needed an outpatient stress test.  I gave him referral to [facility]. MDM   Final diagnoses:  Chest discomfort        Carlyle DollyChristopher W Haylen Bellotti, PA-C 04/30/14 0106  Arby BarretteMarcy Pfeiffer, MD 04/30/14 [phone]

## 2014-05-03 ENCOUNTER — Telehealth: Payer: Self-pay | Admitting: *Deleted

## 2014-05-03 NOTE — Telephone Encounter (Signed)
Pt states he is having allergic reaction to lisnopril.Marland Kitchen.Marland Kitchen.NCM advised to stop taking and to take Benedryl.  NCM made appt for pt with Dr Daleen SquibbWall on Tuesday at 1415.

## 2014-05-05 ENCOUNTER — Ambulatory Visit: Payer: Self-pay | Attending: Cardiology | Admitting: Cardiology

## 2014-05-05 ENCOUNTER — Encounter: Payer: Self-pay | Admitting: Cardiology

## 2014-05-05 VITALS — BP 168/110 | HR 69

## 2014-05-05 DIAGNOSIS — I10 Essential (primary) hypertension: Secondary | ICD-10-CM | POA: Insufficient documentation

## 2014-05-05 DIAGNOSIS — J45909 Unspecified asthma, uncomplicated: Secondary | ICD-10-CM | POA: Insufficient documentation

## 2014-05-05 DIAGNOSIS — Z7982 Long term (current) use of aspirin: Secondary | ICD-10-CM | POA: Insufficient documentation

## 2014-05-05 DIAGNOSIS — Z7951 Long term (current) use of inhaled steroids: Secondary | ICD-10-CM | POA: Insufficient documentation

## 2014-05-05 MED ORDER — CHLORTHALIDONE 25 MG PO TABS: 25.0000 mg | ORAL_TABLET | Freq: Every day | ORAL | Status: DC

## 2014-05-05 MED ORDER — AMLODIPINE BESYLATE 10 MG PO TABS: 10.0000 mg | ORAL_TABLET | Freq: Every day | ORAL | Status: DC

## 2014-05-05 NOTE — Progress Notes (Signed)
Patient presented to ED on 04/29/14 with intermittent substernal chest pain x 2 days that radiated into left hand. Troponins negative Patient denies chest pain, shortness of breath at this time.  Complaining of headaches x 1 month (3-4x/month) and swelling in both hands x 2 weeks. Patient started on lisinopril in ED-took for 3 days then developed hives. Patient stopped taking medication and has been taking Benadryl. BP elevated-194/135 initially, 168/110 after recheck. Dr. Daleen SquibbWall notified. Patient to be started on Amlodipine 10 mg daily

## 2014-05-05 NOTE — Patient Instructions (Addendum)
It was great meeting you today. Please begin taking amlodipine 10 mg daily for high blood pressure. Also begin taking chlorthalidone 25 mg daily which is a diuretic. Please return in one week for a nurse visit to recheck your blood pressure. Please return to see Dr. Daleen SquibbWall in 3 months for visit, labwork.

## 2014-05-05 NOTE — Progress Notes (Signed)
HPI [name] is a 39 year old Afro-American male who comes today from the emergency room after being seen for chest pain. He was also found to have very high blood pressure which is a new diagnosis. Troponins were negative. EKG showed LVH with strain. Chest x-ray dated no acute cardiopulmonary disease and no cardiomegaly.  Started on lisinopril but developed hives. He was prescribed amlodipine in the past but never took it.  He's been having generalized headaches for several months. He denies any visual disturbances. He denies any peripheral edema.  There is no history of kidney disease or diabetes. He does have a history of asthma.    Past Medical History  Diagnosis Date  . Asthma   . Irregular heartbeat     Current Outpatient Prescriptions  Medication Sig Dispense Refill  . aspirin EC 81 MG tablet Take 81 mg by mouth daily.    . cromolyn (NASALCROM) 5.2 MG/ACT nasal spray Place 1 spray into both nostrils daily.    Marland Kitchen. albuterol (PROVENTIL HFA;VENTOLIN HFA) 108 (90 BASE) MCG/ACT inhaler Inhale 2 puffs into the lungs every 4 (four) hours as needed for wheezing or shortness of breath. (Patient not taking: Reported on 05/05/2014) 1 Inhaler 0  . fluticasone (FLONASE) 50 MCG/ACT nasal spray Place 2 sprays into the nose daily. 16 g 0   No current facility-administered medications for this visit.    Allergies  Allergen Reactions  . Lisinopril Hives    Family History  Problem Relation Age of Onset  . Hypertension Mother   . Hypertension Father   . Heart attack Paternal Uncle   . Heart attack Paternal Grandmother   . Heart attack Paternal Grandfather     History   Social History  . Marital Status: Single    Spouse Name: N/A  . Number of Children: N/A  . Years of Education: N/A   Occupational History  . Not on file.   Social History Main Topics  . Smoking status: Former Smoker    Quit date: 04/29/2008  . Smokeless tobacco: Not on file  . Alcohol Use: No  . Drug Use: No   . Sexual Activity: Not on file   Other Topics Concern  . Not on file   Social History Narrative    ROS ALL NEGATIVE EXCEPT THOSE NOTED IN HPI  PE  General Appearance: well developed, well nourished in no acute distress, muscular, obese HEENT: symmetrical face, PERRLA, good dentition  Neck: no JVD, thyromegaly, or adenopathy, trachea midline Chest: symmetric without deformity Cardiac: PMI non-displaced, RRR, normal S1, S2, no gallop or murmur Lung: clear to ausculation and percussion Vascular: all pulses full without bruits  Extremities: no cyanosis, clubbing or edema, no sign of DVT, no varicosities  Skin: normal color, no rashes Neuro: alert and oriented x 3, non-focal Pysch: normal affect  EKG  BMET    Component Value Date/Time   NA 138 04/29/2014 1612   K 3.8 04/29/2014 1612   CL 102 04/29/2014 1612   CO2 28 04/29/2014 1612   GLUCOSE 104* 04/29/2014 1612   BUN 16 04/29/2014 1612   CREATININE 1.32 04/29/2014 1612   CALCIUM 9.6 04/29/2014 1612   GFRNONAA 67* 04/29/2014 1612   GFRAA 77* 04/29/2014 1612    Lipid Panel  No results found for: CHOL, TRIG, HDL, CHOLHDL, VLDL, LDLCALC  CBC    Component Value Date/Time   WBC 6.9 04/29/2014 1612   RBC 4.65 04/29/2014 1612   HGB 14.1 04/29/2014 1612   HCT 42.4 04/29/2014 1612  PLT 242 04/29/2014 1612   MCV 91.2 04/29/2014 1612   MCH 30.3 04/29/2014 1612   MCHC 33.3 04/29/2014 1612   RDW 15.1 04/29/2014 1612   LYMPHSABS 2.8 05/24/2008 1542   MONOABS 0.5 05/24/2008 1542   EOSABS 0.3 05/24/2008 1542   BASOSABS 0.1 05/24/2008 1542

## 2014-05-05 NOTE — Assessment & Plan Note (Signed)
This is apparently a new diagnosis but by the looks of his EKGs probably had some long-standing hypertension. It is probably been worse recently with his headaches.  He is intolerant to an ACE inhibitor. We'll start him on amlodipine 10 mg every morning which she'll start this afternoon. Also begin chlorthalidone 25 mg by mouth every morning. He'll return in a week for blood pressure check. Most likely we will have to add a third drug as I  Explained to him. He is already ollowing a salt free diet and does not eat fast food. I've also asked him to lose about 30 pounds over the next year. He seems motivated to do all of this. We will check fasting lipids when he returns to see me in 3 months.

## 2014-05-24 ENCOUNTER — Ambulatory Visit: Payer: Self-pay | Attending: Internal Medicine | Admitting: *Deleted

## 2014-05-24 VITALS — BP 146/86 | HR 73 | Temp 98.4°F | Resp 16 | Ht 72.0 in | Wt 260.2 lb

## 2014-05-24 DIAGNOSIS — I10 Essential (primary) hypertension: Secondary | ICD-10-CM | POA: Insufficient documentation

## 2014-05-24 NOTE — Progress Notes (Signed)
Patient presents for BP check Med list reviewed; states taking all meds as directed Discussed need for low sodium diet and using Mrs. Dash as alternative to salt Encouraged to choose foods with 5% or less of daily value for sodium. States walking 60 minutes per day for exercise Patient denies SHOB, chest pain or pressure States headaches improved Has had blurred vision close up; thinks he needs glasses  BP  146/86 left arm manually with large cuff P 73 R 16 T  98.4 oral SPO2  97%  Patient advised to call for med refills at least 7 days before running out so as not to go without.  Patient aware that he is to f/u with Cardiologist 3 months from last visit (Due 08/03/14)  Patient given literature on DASH Eating Plan

## 2014-05-24 NOTE — Patient Instructions (Signed)
DASH Eating Plan °DASH stands for "Dietary Approaches to Stop Hypertension." The DASH eating plan is a healthy eating plan that has been shown to reduce high blood pressure (hypertension). Additional health benefits may include reducing the risk of type 2 diabetes mellitus, heart disease, and stroke. The DASH eating plan may also help with weight loss. °WHAT DO I NEED TO KNOW ABOUT THE DASH EATING PLAN? °For the DASH eating plan, you will follow these general guidelines: °· Choose foods with a percent daily value for sodium of less than 5% (as listed on the food label). °· Use salt-free seasonings or herbs instead of table salt or sea salt. °· Check with your health care provider or pharmacist before using salt substitutes. °· Eat lower-sodium products, often labeled as "lower sodium" or "no salt added." °· Eat fresh foods. °· Eat more vegetables, fruits, and low-fat dairy products. °· Choose whole grains. Look for the word "whole" as the first word in the ingredient list. °· Choose fish and skinless chicken or turkey more often than red meat. Limit fish, poultry, and meat to 6 oz (170 g) each day. °· Limit sweets, desserts, sugars, and sugary drinks. °· Choose heart-healthy fats. °· Limit cheese to 1 oz (28 g) per day. °· Eat more home-cooked food and less restaurant, buffet, and fast food. °· Limit fried foods. °· Cook foods using methods other than frying. °· Limit canned vegetables. If you do use them, rinse them well to decrease the sodium. °· When eating at a restaurant, ask that your food be prepared with less salt, or no salt if possible. °WHAT FOODS CAN I EAT? °Seek help from a dietitian for individual calorie needs. °Grains °Whole grain or whole wheat bread. Brown rice. Whole grain or whole wheat pasta. Quinoa, bulgur, and whole grain cereals. Low-sodium cereals. Corn or whole wheat flour tortillas. Whole grain cornbread. Whole grain crackers. Low-sodium crackers. °Vegetables °Fresh or frozen vegetables  (raw, steamed, roasted, or grilled). Low-sodium or reduced-sodium tomato and vegetable juices. Low-sodium or reduced-sodium tomato sauce and paste. Low-sodium or reduced-sodium canned vegetables.  °Fruits °All fresh, canned (in natural juice), or frozen fruits. °Meat and Other Protein Products °Ground beef (85% or leaner), grass-fed beef, or beef trimmed of fat. Skinless chicken or turkey. Ground chicken or turkey. Pork trimmed of fat. All fish and seafood. Eggs. Dried beans, peas, or lentils. Unsalted nuts and seeds. Unsalted canned beans. °Dairy °Low-fat dairy products, such as skim or 1% milk, 2% or reduced-fat cheeses, low-fat ricotta or cottage cheese, or plain low-fat yogurt. Low-sodium or reduced-sodium cheeses. °Fats and Oils °Tub margarines without trans fats. Light or reduced-fat mayonnaise and salad dressings (reduced sodium). Avocado. Safflower, olive, or canola oils. Natural peanut or almond butter. °Other °Unsalted popcorn and pretzels. °The items listed above may not be a complete list of recommended foods or beverages. Contact your dietitian for more options. °WHAT FOODS ARE NOT RECOMMENDED? °Grains °White bread. White pasta. White rice. Refined cornbread. Bagels and croissants. Crackers that contain trans fat. °Vegetables °Creamed or fried vegetables. Vegetables in a cheese sauce. Regular canned vegetables. Regular canned tomato sauce and paste. Regular tomato and vegetable juices. °Fruits °Dried fruits. Canned fruit in light or heavy syrup. Fruit juice. °Meat and Other Protein Products °Fatty cuts of meat. Ribs, chicken wings, bacon, sausage, bologna, salami, chitterlings, fatback, hot dogs, bratwurst, and packaged luncheon meats. Salted nuts and seeds. Canned beans with salt. °Dairy °Whole or 2% milk, cream, half-and-half, and cream cheese. Whole-fat or sweetened yogurt. Full-fat   cheeses or blue cheese. Nondairy creamers and whipped toppings. Processed cheese, cheese spreads, or cheese  curds. °Condiments °Onion and garlic salt, seasoned salt, table salt, and sea salt. Canned and packaged gravies. Worcestershire sauce. Tartar sauce. Barbecue sauce. Teriyaki sauce. Soy sauce, including reduced sodium. Steak sauce. Fish sauce. Oyster sauce. Cocktail sauce. Horseradish. Ketchup and mustard. Meat flavorings and tenderizers. Bouillon cubes. Hot sauce. Tabasco sauce. Marinades. Taco seasonings. Relishes. °Fats and Oils °Butter, stick margarine, lard, shortening, ghee, and bacon fat. Coconut, palm kernel, or palm oils. Regular salad dressings. °Other °Pickles and olives. Salted popcorn and pretzels. °The items listed above may not be a complete list of foods and beverages to avoid. Contact your dietitian for more information. °WHERE CAN I FIND MORE INFORMATION? °National Heart, [employer], [employer] Institute: www.nhlbi.nih.gov/health/health-topics/topics/dash/ °Document Released: 02/15/2011 Document Revised: 07/13/2013 Document Reviewed: 12/31/2012 °ExitCare® Patient Information ©2015 [employer]. This information is not intended to replace advice given to you by your health care provider. Make sure you discuss any questions you have with your health care provider. ° °

## 2014-05-31 ENCOUNTER — Encounter: Payer: Self-pay | Admitting: Internal Medicine

## 2014-05-31 ENCOUNTER — Ambulatory Visit: Payer: Self-pay | Attending: Internal Medicine | Admitting: Internal Medicine

## 2014-05-31 VITALS — BP 140/90 | HR 86 | Temp 98.0°F | Resp 16 | Wt 262.0 lb

## 2014-05-31 DIAGNOSIS — Z139 Encounter for screening, unspecified: Secondary | ICD-10-CM | POA: Insufficient documentation

## 2014-05-31 DIAGNOSIS — I10 Essential (primary) hypertension: Secondary | ICD-10-CM | POA: Insufficient documentation

## 2014-05-31 DIAGNOSIS — H538 Other visual disturbances: Secondary | ICD-10-CM | POA: Insufficient documentation

## 2014-05-31 DIAGNOSIS — Z833 Family history of diabetes mellitus: Secondary | ICD-10-CM | POA: Insufficient documentation

## 2014-05-31 DIAGNOSIS — Z23 Encounter for immunization: Secondary | ICD-10-CM | POA: Insufficient documentation

## 2014-05-31 LAB — COMPLETE METABOLIC PANEL WITH GFR
ALBUMIN: 4.1 g/dL (ref 3.5–5.2)
ALT: 15 U/L (ref 0–53)
AST: 14 U/L (ref 0–37)
Alkaline Phosphatase: 52 U/L (ref 39–117)
BUN: 22 mg/dL (ref 6–23)
CHLORIDE: 98 meq/L (ref 96–112)
CO2: 32 meq/L (ref 19–32)
CREATININE: 1.12 mg/dL (ref 0.50–1.35)
Calcium: 10 mg/dL (ref 8.4–10.5)
GFR, Est Non African American: 82 mL/min
GLUCOSE: 114 mg/dL — AB (ref 70–99)
POTASSIUM: 3.3 meq/L — AB (ref 3.5–5.3)
Sodium: 140 mEq/L (ref 135–145)
Total Bilirubin: 0.2 mg/dL (ref 0.2–1.2)
Total Protein: 7.6 g/dL (ref 6.0–8.3)

## 2014-05-31 LAB — HEMOGLOBIN A1C
Hgb A1c MFr Bld: 6.2 % — ABNORMAL HIGH (ref <5.7)
Mean Plasma Glucose: 131 mg/dL — ABNORMAL HIGH (ref <117)

## 2014-05-31 MED ORDER — AMLODIPINE BESYLATE 10 MG PO TABS: 10.0000 mg | ORAL_TABLET | Freq: Every day | ORAL | Status: DC

## 2014-05-31 MED ORDER — CHLORTHALIDONE 25 MG PO TABS: 25.0000 mg | ORAL_TABLET | Freq: Every day | ORAL | Status: DC

## 2014-05-31 NOTE — Progress Notes (Signed)
Patient here to establish care Currently on medication for hypertension Has not complaints at this time

## 2014-05-31 NOTE — Progress Notes (Signed)
Patient Demographics  Justin Liu, is a 39 y.o. male  [id number]  [id number]  DOB - [birth year]  CC:  Chief Complaint  Patient presents with  . Establish Care       HPI: Justin MedinMichael Liu is a 39 y.o. male here today to establish medical care.last month patient went to the emergency room with symptoms of chest pain at that time his blood pressure was elevated and he was started on lisinopril which she could not tolerate subsequently was seen by a cardiologist and was started on amlodipine and chlorthalidone, currently patient denies any chest pain or shortness of breath has been compliant with his medications his manual blood pressure today is 140/90. Patient is going to try to modify his diet and do regular exercise.patient used to smoke cigarettes in the past he has already quit. Patient has No headache, No chest pain, No abdominal pain - No Nausea, No new weakness tingling or numbness, No Cough - SOB.  Allergies  Allergen Reactions  . Lisinopril Hives   Past Medical History  Diagnosis Date  . Asthma   . Irregular heartbeat   . Hypertension    Current Outpatient Prescriptions on File Prior to Visit  Medication Sig Dispense Refill  . albuterol (PROVENTIL HFA;VENTOLIN HFA) 108 (90 BASE) MCG/ACT inhaler Inhale 2 puffs into the lungs every 4 (four) hours as needed for wheezing or shortness of breath. 1 Inhaler 0  . aspirin EC 81 MG tablet Take 81 mg by mouth daily.    . cromolyn (NASALCROM) 5.2 MG/ACT nasal spray Place 1 spray into both nostrils daily.    . fluticasone (FLONASE) 50 MCG/ACT nasal spray Place 2 sprays into the nose daily. 16 g 0   No current facility-administered medications on file prior to visit.   Family History  Problem Relation Age of Onset  . Hypertension Mother   . Stroke Mother   . Hypertension Father   . Diabetes Father   . Heart disease Father   . Heart attack Paternal Uncle   . Heart attack Paternal Grandmother   . Heart disease  Paternal Grandmother   . Heart attack Paternal Grandfather   . Heart disease Paternal Grandfather    History   Social History  . Marital Status: Single    Spouse Name: N/A  . Number of Children: N/A  . Years of Education: N/A   Occupational History  . Not on file.   Social History Main Topics  . Smoking status: Former Smoker -- 10 years    Quit date: 04/29/2008  . Smokeless tobacco: Not on file  . Alcohol Use: No  . Drug Use: No  . Sexual Activity: Not on file   Other Topics Concern  . Not on file   Social History Narrative    Review of Systems: Constitutional: Negative for fever, chills, diaphoresis, activity change, appetite change and fatigue. HENT: Negative for ear pain, nosebleeds, congestion, facial swelling, rhinorrhea, neck pain, neck stiffness and ear discharge.  Eyes: Negative for pain, discharge, redness, itching and visual disturbance. Respiratory: Negative for cough, choking, chest tightness, shortness of breath, wheezing and stridor.  Cardiovascular: Negative for chest pain, palpitations and leg swelling. Gastrointestinal: Negative for abdominal distention. Genitourinary: Negative for dysuria, urgency, frequency, hematuria, flank pain, decreased urine volume, difficulty urinating and dyspareunia.  Musculoskeletal: Negative for back pain, joint swelling, arthralgia and gait problem. Neurological: Negative for dizziness, tremors, seizures, syncope, facial asymmetry, speech difficulty, weakness, light-headedness, numbness and headaches.  Hematological: Negative for adenopathy.  Does not bruise/bleed easily. Psychiatric/Behavioral: Negative for hallucinations, behavioral problems, confusion, dysphoric mood, decreased concentration and agitation.    Objective:   Filed Vitals:   05/31/14 1231  BP: 140/90  Pulse:   Temp:   Resp:     Physical Exam: Constitutional: Patient appears well-developed and well-nourished. No distress. HENT: Normocephalic,  atraumatic, External right and left ear normal. Oropharynx is clear and moist.  Eyes: Conjunctivae and EOM are normal. PERRLA, no scleral icterus. Neck: Normal ROM. Neck supple. No JVD. No tracheal deviation. No thyromegaly. CVS: RRR, S1/S2 +, no murmurs, no gallops, no carotid bruit.  Pulmonary: Effort and breath sounds normal, no stridor, rhonchi, wheezes, rales.  Abdominal: Soft. BS +, no distension, tenderness, rebound or guarding.  Musculoskeletal: Normal range of motion. No edema and no tenderness.  Neuro: Alert. Normal reflexes, muscle tone coordination. No cranial nerve deficit. Skin: Skin is warm and dry. No rash noted. Not diaphoretic. No erythema. No pallor. Psychiatric: Normal mood and affect. Behavior, judgment, thought content normal.  Lab Results  Component Value Date   WBC 6.9 04/29/2014   HGB 14.1 04/29/2014   HCT 42.4 04/29/2014   MCV 91.2 04/29/2014   PLT 242 04/29/2014   Lab Results  Component Value Date   CREATININE 1.32 04/29/2014   BUN 16 04/29/2014   NA 138 04/29/2014   K 3.8 04/29/2014   CL 102 04/29/2014   CO2 28 04/29/2014    No results found for: HGBA1C Lipid Panel  No results found for: CHOL, TRIG, HDL, CHOLHDL, VLDL, LDLCALC     Assessment and plan:   1. Essential hypertension Today blood pressure manual is 140/90 he is on amlodipine and chlorthalidone, we'll continue with current medications also advise for DASH diet as well as regular exercise. - amLODipine (NORVASC) 10 MG tablet; Take 1 tablet (10 mg total) by mouth daily. Take every morning.  Dispense: 90 tablet; Refill: 1 - chlorthalidone (HYGROTON) 25 MG tablet; Take 1 tablet (25 mg total) by mouth daily. Take every morning  Dispense: 90 tablet; Refill: 1 - COMPLETE METABOLIC PANEL WITH GFR  2. Family history of diabetes mellitus (DM)  - COMPLETE METABOLIC PANEL WITH GFR - Hemoglobin A1c  3. Needs flu shot Flu shot given today  4. Blurry vision  - Ambulatory referral to  Ophthalmology  5. Screening  - Vit D  25 hydroxy (rtn osteoporosis monitoring)      Health Maintenance  -Vaccinations:  Flu shot given today.  Return in about 3 months (around 08/31/2014) for hypertension.    The patient was given clear instructions to go to ER or return to medical center if symptoms don't improve, worsen or new problems develop. The patient verbalized understanding. The patient was told to call to get lab results if they haven't heard anything in the next week.    This note has been created with Education officer, environmental. Any transcriptional errors are unintentional.   Doris Cheadle, MD

## 2014-05-31 NOTE — Patient Instructions (Signed)
DASH Eating Plan °DASH stands for "Dietary Approaches to Stop Hypertension." The DASH eating plan is a healthy eating plan that has been shown to reduce high blood pressure (hypertension). Additional health benefits may include reducing the risk of type 2 diabetes mellitus, heart disease, and stroke. The DASH eating plan may also help with weight loss. °WHAT DO I NEED TO KNOW ABOUT THE DASH EATING PLAN? °For the DASH eating plan, you will follow these general guidelines: °· Choose foods with a percent daily value for sodium of less than 5% (as listed on the food label). °· Use salt-free seasonings or herbs instead of table salt or sea salt. °· Check with your health care provider or pharmacist before using salt substitutes. °· Eat lower-sodium products, often labeled as "lower sodium" or "no salt added." °· Eat fresh foods. °· Eat more vegetables, fruits, and low-fat dairy products. °· Choose whole grains. Look for the word "whole" as the first word in the ingredient list. °· Choose fish and skinless chicken or turkey more often than red meat. Limit fish, poultry, and meat to 6 oz (170 g) each day. °· Limit sweets, desserts, sugars, and sugary drinks. °· Choose heart-healthy fats. °· Limit cheese to 1 oz (28 g) per day. °· Eat more home-cooked food and less restaurant, buffet, and fast food. °· Limit fried foods. °· Cook foods using methods other than frying. °· Limit canned vegetables. If you do use them, rinse them well to decrease the sodium. °· When eating at a restaurant, ask that your food be prepared with less salt, or no salt if possible. °WHAT FOODS CAN I EAT? °Seek help from a dietitian for individual calorie needs. °Grains °Whole grain or whole wheat bread. Brown rice. Whole grain or whole wheat pasta. Quinoa, bulgur, and whole grain cereals. Low-sodium cereals. Corn or whole wheat flour tortillas. Whole grain cornbread. Whole grain crackers. Low-sodium crackers. °Vegetables °Fresh or frozen vegetables  (raw, steamed, roasted, or grilled). Low-sodium or reduced-sodium tomato and vegetable juices. Low-sodium or reduced-sodium tomato sauce and paste. Low-sodium or reduced-sodium canned vegetables.  °Fruits °All fresh, canned (in natural juice), or frozen fruits. °Meat and Other Protein Products °Ground beef (85% or leaner), grass-fed beef, or beef trimmed of fat. Skinless chicken or turkey. Ground chicken or turkey. Pork trimmed of fat. All fish and seafood. Eggs. Dried beans, peas, or lentils. Unsalted nuts and seeds. Unsalted canned beans. °Dairy °Low-fat dairy products, such as skim or 1% milk, 2% or reduced-fat cheeses, low-fat ricotta or cottage cheese, or plain low-fat yogurt. Low-sodium or reduced-sodium cheeses. °Fats and Oils °Tub margarines without trans fats. Light or reduced-fat mayonnaise and salad dressings (reduced sodium). Avocado. Safflower, olive, or canola oils. Natural peanut or almond butter. °Other °Unsalted popcorn and pretzels. °The items listed above may not be a complete list of recommended foods or beverages. Contact your dietitian for more options. °WHAT FOODS ARE NOT RECOMMENDED? °Grains °White bread. White pasta. White rice. Refined cornbread. Bagels and croissants. Crackers that contain trans fat. °Vegetables °Creamed or fried vegetables. Vegetables in a cheese sauce. Regular canned vegetables. Regular canned tomato sauce and paste. Regular tomato and vegetable juices. °Fruits °Dried fruits. Canned fruit in light or heavy syrup. Fruit juice. °Meat and Other Protein Products °Fatty cuts of meat. Ribs, chicken wings, bacon, sausage, bologna, salami, chitterlings, fatback, hot dogs, bratwurst, and packaged luncheon meats. Salted nuts and seeds. Canned beans with salt. °Dairy °Whole or 2% milk, cream, half-and-half, and cream cheese. Whole-fat or sweetened yogurt. Full-fat   cheeses or blue cheese. Nondairy creamers and whipped toppings. Processed cheese, cheese spreads, or cheese  curds. °Condiments °Onion and garlic salt, seasoned salt, table salt, and sea salt. Canned and packaged gravies. Worcestershire sauce. Tartar sauce. Barbecue sauce. Teriyaki sauce. Soy sauce, including reduced sodium. Steak sauce. Fish sauce. Oyster sauce. Cocktail sauce. Horseradish. Ketchup and mustard. Meat flavorings and tenderizers. Bouillon cubes. Hot sauce. Tabasco sauce. Marinades. Taco seasonings. Relishes. °Fats and Oils °Butter, stick margarine, lard, shortening, ghee, and bacon fat. Coconut, palm kernel, or palm oils. Regular salad dressings. °Other °Pickles and olives. Salted popcorn and pretzels. °The items listed above may not be a complete list of foods and beverages to avoid. Contact your dietitian for more information. °WHERE CAN I FIND MORE INFORMATION? °National Heart, [employer], [employer] Institute: www.nhlbi.nih.gov/health/health-topics/topics/dash/ °Document Released: 02/15/2011 Document Revised: 07/13/2013 Document Reviewed: 12/31/2012 °ExitCare® Patient Information ©2015 [employer]. This information is not intended to replace advice given to you by your health care provider. Make sure you discuss any questions you have with your health care provider. ° °

## 2014-06-01 ENCOUNTER — Telehealth: Payer: Self-pay | Admitting: Internal Medicine

## 2014-06-01 ENCOUNTER — Telehealth: Payer: Self-pay

## 2014-06-01 LAB — VITAMIN D 25 HYDROXY (VIT D DEFICIENCY, FRACTURES): Vit D, 25-Hydroxy: 12 ng/mL — ABNORMAL LOW (ref 30–100)

## 2014-06-01 MED ORDER — POTASSIUM CHLORIDE CRYS ER 20 MEQ PO TBCR: 20.0000 meq | EXTENDED_RELEASE_TABLET | Freq: Every day | ORAL | Status: DC

## 2014-06-01 MED ORDER — VITAMIN D (ERGOCALCIFEROL) 1.25 MG (50000 UNIT) PO CAPS: 50000.0000 [IU] | ORAL_CAPSULE | ORAL | Status: DC

## 2014-06-01 NOTE — Telephone Encounter (Signed)
-----   Message from Doris Cheadleeepak Advani, MD sent at 06/01/2014  9:28 AM EDT ----- Blood work reviewed  potassium is borderline low, advise patient to take KCL 20 mEq daily for 10 days.  noticed low vitamin D, call patient advise to start ergocalciferol 50,000 units once a week for the duration of  12 weeks.  noticed hemoglobin A1c of 6.2%, patient has prediabetes, call and advise patient for low carbohydrate diet.

## 2014-06-01 NOTE — Telephone Encounter (Signed)
Pt called returning nurse's phone call, please f/u with pt  °

## 2014-06-01 NOTE — Telephone Encounter (Signed)
Patient not available Left message on voice mail to return our call 

## 2014-06-04 ENCOUNTER — Encounter: Payer: Self-pay | Admitting: *Deleted

## 2014-06-04 ENCOUNTER — Telehealth: Payer: Self-pay | Admitting: Internal Medicine

## 2014-06-04 NOTE — Progress Notes (Signed)
Pt is aware of his lab results. 

## 2014-06-04 NOTE — Telephone Encounter (Signed)
Pt called returning nurse's phone call. °

## 2014-07-19 ENCOUNTER — Ambulatory Visit: Payer: Self-pay | Attending: Internal Medicine | Admitting: Internal Medicine

## 2014-07-19 ENCOUNTER — Encounter: Payer: Self-pay | Admitting: Internal Medicine

## 2014-07-19 VITALS — BP 136/92 | HR 81 | Temp 98.0°F | Resp 16 | Wt 266.0 lb

## 2014-07-19 DIAGNOSIS — L609 Nail disorder, unspecified: Secondary | ICD-10-CM | POA: Insufficient documentation

## 2014-07-19 DIAGNOSIS — M79645 Pain in left finger(s): Secondary | ICD-10-CM | POA: Insufficient documentation

## 2014-07-19 MED ORDER — IBUPROFEN 600 MG PO TABS: 600.0000 mg | ORAL_TABLET | Freq: Three times a day (TID) | ORAL | Status: DC | PRN

## 2014-07-19 NOTE — Progress Notes (Signed)
Patient here for left thumb pain Patient states he hit his thumb with a sledge hammer a few month ago Half of the nail fell off but does not seem to want to grow back Patient is subconscious of the way it looks and also affecting his daily activities It he hits it the area is very tender and painful

## 2014-07-19 NOTE — Progress Notes (Signed)
MRN: [id number] Name: Justin Liu  Sex: male Age: 39 y.o. DOB: [birth year]  Allergies: Lisinopril  Chief Complaint  Patient presents with  . thumb pain    left    HPI: Patient is 39 y.o. male who has history of hypertension, comes today reported to have on and off left thumb pain,patient does report  accidentally hitting his left thumb with sledgehammer 3 months ago, he has noticed split in his fingernail, it has not regrown well and sometimes causing pain, denies any fever chills or discharge.  Past Medical History  Diagnosis Date  . Asthma   . Irregular heartbeat   . Hypertension     History reviewed. No pertinent past surgical history.    Medication List       This list is accurate as of: 07/19/14  5:56 PM.  Always use your most recent med list.               albuterol 108 (90 BASE) MCG/ACT inhaler  Commonly known as:  PROVENTIL HFA;VENTOLIN HFA  Inhale 2 puffs into the lungs every 4 (four) hours as needed for wheezing or shortness of breath.     amLODipine 10 MG tablet  Commonly known as:  NORVASC  Take 1 tablet (10 mg total) by mouth daily. Take every morning.     aspirin EC 81 MG tablet  Take 81 mg by mouth daily.     chlorthalidone 25 MG tablet  Commonly known as:  HYGROTON  Take 1 tablet (25 mg total) by mouth daily. Take every morning     cromolyn 5.2 MG/ACT nasal spray  Commonly known as:  NASALCROM  Place 1 spray into both nostrils daily.     fluticasone 50 MCG/ACT nasal spray  Commonly known as:  FLONASE  Place 2 sprays into the nose daily.     ibuprofen 600 MG tablet  Commonly known as:  ADVIL,MOTRIN  Take 1 tablet (600 mg total) by mouth every 8 (eight) hours as needed.     potassium chloride SA 20 MEQ tablet  Commonly known as:  K-DUR,KLOR-CON  Take 1 tablet (20 mEq total) by mouth daily.     Vitamin D (Ergocalciferol) 50000 UNITS Caps capsule  Commonly known as:  DRISDOL  Take 1 capsule (50,000 Units total) by mouth every  7 (seven) days.        Meds ordered this encounter  Medications  . ibuprofen (ADVIL,MOTRIN) 600 MG tablet    Sig: Take 1 tablet (600 mg total) by mouth every 8 (eight) hours as needed.    Dispense:  30 tablet    Refill:  1    Immunization History  Administered Date(s) Administered  . Influenza,inj,Quad PF,36+ Mos 05/31/2014    Family History  Problem Relation Age of Onset  . Hypertension Mother   . Stroke Mother   . Hypertension Father   . Diabetes Father   . Heart disease Father   . Heart attack Paternal Uncle   . Heart attack Paternal Grandmother   . Heart disease Paternal Grandmother   . Heart attack Paternal Grandfather   . Heart disease Paternal Grandfather     History  Substance Use Topics  . Smoking status: Former Smoker -- 10 years    Quit date: 04/29/2008  . Smokeless tobacco: Not on file  . Alcohol Use: No    Review of Systems   As noted in HPI  Filed Vitals:   07/19/14 1506  BP: 136/92  Pulse: 81  Temp: 98 F (36.7 C)  Resp: 16    Physical Exam  Physical Exam  Constitutional: No distress.  Eyes: EOM are normal. Pupils are equal, round, and reactive to light.  Cardiovascular: Normal rate and regular rhythm.   Pulmonary/Chest: Breath sounds normal. No respiratory distress. He has no wheezes. He has no rales.  Musculoskeletal:  Left hand thumb split in nail minimal tenderness no surrounding erythema swelling or discharge( no signs of infection)    CBC    Component Value Date/Time   WBC 6.9 04/29/2014 1612   WBC 6.8 10/16/2013 2029   RBC 4.65 04/29/2014 1612   RBC 4.62 10/16/2013 2029   HGB 14.1 04/29/2014 1612   HGB 14.6 10/16/2013 2029   HCT 42.4 04/29/2014 1612   HCT 43.9 10/16/2013 2029   PLT 242 04/29/2014 1612   PLT 241 10/16/2013 2029   MCV 91.2 04/29/2014 1612   MCV 95 10/16/2013 2029   LYMPHSABS 2.8 05/24/2008 1542   MONOABS 0.5 05/24/2008 1542   EOSABS 0.3 05/24/2008 1542   BASOSABS 0.1 05/24/2008 1542    CMP       Component Value Date/Time   NA 140 05/31/2014 1247   NA 139 10/16/2013 2029   K 3.3* 05/31/2014 1247   K 4.3 10/16/2013 2029   CL 98 05/31/2014 1247   CL 104 10/16/2013 2029   CO2 32 05/31/2014 1247   CO2 31 10/16/2013 2029   GLUCOSE 114* 05/31/2014 1247   GLUCOSE 99 10/16/2013 2029   BUN 22 05/31/2014 1247   BUN 13 10/16/2013 2029   CREATININE 1.12 05/31/2014 1247   CREATININE 1.32 04/29/2014 1612   CREATININE 1.29 10/16/2013 2029   CALCIUM 10.0 05/31/2014 1247   CALCIUM 8.9 10/16/2013 2029   PROT 7.6 05/31/2014 1247   ALBUMIN 4.1 05/31/2014 1247   AST 14 05/31/2014 1247   ALT 15 05/31/2014 1247   ALKPHOS 52 05/31/2014 1247   BILITOT 0.2 05/31/2014 1247   GFRNONAA 82 05/31/2014 1247   GFRNONAA 67* 04/29/2014 1612   GFRNONAA >60 10/16/2013 2029   GFRAA >89 05/31/2014 1247   GFRAA 77* 04/29/2014 1612   GFRAA >60 10/16/2013 2029    No results found for: CHOL  Lab Results  Component Value Date/Time   HGBA1C 6.2* 05/31/2014 12:47 PM    Lab Results  Component Value Date/Time   AST 14 05/31/2014 12:47 PM    Assessment and Plan  Nail complaint - Plan: Ambulatory referral to Hand Surgery, ibuprofen (ADVIL,MOTRIN) 600 MG tablet  Thumb pain, left - Plan: Ambulatory referral to Hand Surgery, ibuprofen (ADVIL,MOTRIN) 600 MG tablet   follow up up as scheduled  This note has been created with Education officer, environmentalDragon speech recognition software and smart phrase technology. Any transcriptional errors are unintentional.    Doris CheadleADVANI, Bradlee Heitman, MD

## 2014-07-29 ENCOUNTER — Encounter: Payer: Self-pay | Admitting: Family Medicine

## 2014-07-29 ENCOUNTER — Ambulatory Visit (INDEPENDENT_AMBULATORY_CARE_PROVIDER_SITE_OTHER): Payer: Self-pay | Admitting: Family Medicine

## 2014-07-29 ENCOUNTER — Ambulatory Visit: Payer: Self-pay | Admitting: Family Medicine

## 2014-07-29 DIAGNOSIS — S6982XA Other specified injuries of left wrist, hand and finger(s), initial encounter: Secondary | ICD-10-CM

## 2014-07-29 MED ORDER — HYDROCODONE-ACETAMINOPHEN 5-325 MG PO TABS: 1.0000 | ORAL_TABLET | Freq: Four times a day (QID) | ORAL | Status: DC | PRN

## 2014-07-29 NOTE — Patient Instructions (Signed)
Continue ibuprofen as you have been. Norco as needed for severe pain. This is best treated with nail removal - if your family physician is not comfortable treating this they should refer you to a hand surgeon.

## 2014-07-30 DIAGNOSIS — IMO0002 Reserved for concepts with insufficient information to code with codable children: Secondary | ICD-10-CM | POA: Insufficient documentation

## 2014-07-30 NOTE — Progress Notes (Signed)
Patient came in today as a referral from PCP for his left thumbnail.  He hit this with a sledgehammer in January, part of nail broke off - as this part has grown back it has become thicker, painful, lifting off from nailbed.  Referral indicates both 'referral to hand surgery' and 'referral to sports medicine' though this is not a condition treated by a sports medicine office.  Nail removal is typically done by PCP or hand surgery.  Advised patient we would no charge him for today's visit.  He has no pain, full motion and strength of thumb - issue is specific to the nail.  He should discuss with PCP's office to get referral to hand surgery if they do not perform nail removal in their office.

## 2014-08-06 ENCOUNTER — Encounter: Payer: Self-pay | Admitting: Internal Medicine

## 2014-08-06 ENCOUNTER — Ambulatory Visit: Payer: Self-pay | Attending: Internal Medicine | Admitting: Internal Medicine

## 2014-08-06 VITALS — BP 132/81 | HR 85 | Temp 98.8°F | Resp 16 | Ht 72.0 in | Wt 261.0 lb

## 2014-08-06 DIAGNOSIS — L609 Nail disorder, unspecified: Secondary | ICD-10-CM

## 2014-08-06 DIAGNOSIS — R7303 Prediabetes: Secondary | ICD-10-CM

## 2014-08-06 DIAGNOSIS — E876 Hypokalemia: Secondary | ICD-10-CM | POA: Insufficient documentation

## 2014-08-06 DIAGNOSIS — I10 Essential (primary) hypertension: Secondary | ICD-10-CM | POA: Insufficient documentation

## 2014-08-06 DIAGNOSIS — R7309 Other abnormal glucose: Secondary | ICD-10-CM | POA: Insufficient documentation

## 2014-08-06 DIAGNOSIS — M79645 Pain in left finger(s): Secondary | ICD-10-CM

## 2014-08-06 NOTE — Progress Notes (Signed)
MRN: [id number] Name: Justin Liu  Sex: male Age: 39 y.o. DOB: 11/08/1975  Allergies: Lisinopril  No chief complaint on file.   HPI: Patient is 39 y.o. male who has history of hypertension, prediabetes, vitamin D deficiency, comes today for followup, on the last visit he was referred to hand surgeon since he has history of left thumb injury with split in the fingernail,  Somehow he ended up seeing the sports medicine Dr. And was advised that he needs another referral to see a hand surgeon, is requesting the referral today, previous blood work reviewed with the patient, noticed low potassium level, as per patient he took the potassium supplement.  Past Medical History  Diagnosis Date  . Asthma   . Irregular heartbeat   . Hypertension     History reviewed. No pertinent past surgical history.    Medication List       This list is accurate as of: 08/06/14 12:37 PM.  Always use your most recent med list.               albuterol 108 (90 BASE) MCG/ACT inhaler  Commonly known as:  PROVENTIL HFA;VENTOLIN HFA  Inhale 2 puffs into the lungs every 4 (four) hours as needed for wheezing or shortness of breath.     amLODipine 10 MG tablet  Commonly known as:  NORVASC  Take 1 tablet (10 mg total) by mouth daily. Take every morning.     aspirin EC 81 MG tablet  Take 81 mg by mouth daily.     chlorthalidone 25 MG tablet  Commonly known as:  HYGROTON  Take 1 tablet (25 mg total) by mouth daily. Take every morning     cromolyn 5.2 MG/ACT nasal spray  Commonly known as:  NASALCROM  Place 1 spray into both nostrils daily.     fluticasone 50 MCG/ACT nasal spray  Commonly known as:  FLONASE  Place 2 sprays into the nose daily.     HYDROcodone-acetaminophen 5-325 MG per tablet  Commonly known as:  NORCO  Take 1 tablet by mouth every 6 (six) hours as needed for moderate pain.     ibuprofen 600 MG tablet  Commonly known as:  ADVIL,MOTRIN  Take 1 tablet (600 mg total)  by mouth every 8 (eight) hours as needed.     potassium chloride SA 20 MEQ tablet  Commonly known as:  K-DUR,KLOR-CON  Take 1 tablet (20 mEq total) by mouth daily.     Vitamin D (Ergocalciferol) 50000 UNITS Caps capsule  Commonly known as:  DRISDOL  Take 1 capsule (50,000 Units total) by mouth every 7 (seven) days.        No orders of the defined types were placed in this encounter.    Immunization History  Administered Date(s) Administered  . Influenza,inj,Quad PF,36+ Mos 05/31/2014    Family History  Problem Relation Age of Onset  . Hypertension Mother   . Stroke Mother   . Hypertension Father   . Diabetes Father   . Heart disease Father   . Heart attack Paternal Uncle   . Heart attack Paternal Grandmother   . Heart disease Paternal Grandmother   . Heart attack Paternal Grandfather   . Heart disease Paternal Grandfather     History  Substance Use Topics  . Smoking status: Former Smoker -- 10 years    Quit date: 04/29/2008  . Smokeless tobacco: Not on file  . Alcohol Use: No    Review of Systems  As noted in HPI  Filed Vitals:   08/06/14 1155  BP: 132/81  Pulse: 85  Temp: 98.8 F (37.1 C)  Resp: 16    Physical Exam  Physical Exam  Constitutional: No distress.  Eyes: EOM are normal. Pupils are equal, round, and reactive to light.  Cardiovascular: Normal rate and regular rhythm.   Pulmonary/Chest: Breath sounds normal. No respiratory distress. He has no wheezes. He has no rales.  Musculoskeletal:  Left hand thumb split in nail minimal tenderness no surrounding erythema swelling or discharge    CBC    Component Value Date/Time   WBC 6.9 04/29/2014 1612   WBC 6.8 10/16/2013 2029   RBC 4.65 04/29/2014 1612   RBC 4.62 10/16/2013 2029   HGB 14.1 04/29/2014 1612   HGB 14.6 10/16/2013 2029   HCT 42.4 04/29/2014 1612   HCT 43.9 10/16/2013 2029   PLT 242 04/29/2014 1612   PLT 241 10/16/2013 2029   MCV 91.2 04/29/2014 1612   MCV 95 10/16/2013  2029   LYMPHSABS 2.8 05/24/2008 1542   MONOABS 0.5 05/24/2008 1542   EOSABS 0.3 05/24/2008 1542   BASOSABS 0.1 05/24/2008 1542    CMP     Component Value Date/Time   NA 140 05/31/2014 1247   NA 139 10/16/2013 2029   K 3.3* 05/31/2014 1247   K 4.3 10/16/2013 2029   CL 98 05/31/2014 1247   CL 104 10/16/2013 2029   CO2 32 05/31/2014 1247   CO2 31 10/16/2013 2029   GLUCOSE 114* 05/31/2014 1247   GLUCOSE 99 10/16/2013 2029   BUN 22 05/31/2014 1247   BUN 13 10/16/2013 2029   CREATININE 1.12 05/31/2014 1247   CREATININE 1.32 04/29/2014 1612   CREATININE 1.29 10/16/2013 2029   CALCIUM 10.0 05/31/2014 1247   CALCIUM 8.9 10/16/2013 2029   PROT 7.6 05/31/2014 1247   ALBUMIN 4.1 05/31/2014 1247   AST 14 05/31/2014 1247   ALT 15 05/31/2014 1247   ALKPHOS 52 05/31/2014 1247   BILITOT 0.2 05/31/2014 1247   GFRNONAA 82 05/31/2014 1247   GFRNONAA 67* 04/29/2014 1612   GFRNONAA >60 10/16/2013 2029   GFRAA >89 05/31/2014 1247   GFRAA 77* 04/29/2014 1612   GFRAA >60 10/16/2013 2029    No results found for: CHOL  Lab Results  Component Value Date/Time   HGBA1C 6.2* 05/31/2014 12:47 PM    Lab Results  Component Value Date/Time   AST 14 05/31/2014 12:47 PM    Assessment and Plan  Essential hypertension - Plan: blood pressure is well controlled, continue with current meds COMPLETE METABOLIC PANEL WITH GFR  Nail complaint/Thumb pain, left - Plan: Ambulatory referral to Hand Surgery  Hypokalemia - Plan: will repeat blood chemistry COMPLETE METABOLIC PANEL WITH GFR  Prediabetes - Plan: advised patient for low carbohydrate diet, recheck COMPLETE METABOLIC PANEL WITH GFR, Hemoglobin A1c   Return in about 3 months (around 11/06/2014), or if symptoms worsen or fail to improve.   This note has been created with Education officer, environmental. Any transcriptional errors are unintentional.    Doris Cheadle, MD

## 2014-08-06 NOTE — Progress Notes (Signed)
F/U finger Injury  Still with pain, no ligament damage  Nail removal  Referral to hand surgeon

## 2014-08-07 LAB — COMPLETE METABOLIC PANEL WITH GFR
ALT: 25 U/L (ref 0–53)
AST: 23 U/L (ref 0–37)
Albumin: 4.3 g/dL (ref 3.5–5.2)
Alkaline Phosphatase: 55 U/L (ref 39–117)
BUN: 26 mg/dL — ABNORMAL HIGH (ref 6–23)
CALCIUM: 9.2 mg/dL (ref 8.4–10.5)
CHLORIDE: 94 meq/L — AB (ref 96–112)
CO2: 29 meq/L (ref 19–32)
Creat: 1.34 mg/dL (ref 0.50–1.35)
GFR, Est African American: 77 mL/min
GFR, Est Non African American: 66 mL/min
Glucose, Bld: 124 mg/dL — ABNORMAL HIGH (ref 70–99)
Potassium: 2.9 mEq/L — ABNORMAL LOW (ref 3.5–5.3)
SODIUM: 139 meq/L (ref 135–145)
Total Bilirubin: 0.4 mg/dL (ref 0.2–1.2)
Total Protein: 8.1 g/dL (ref 6.0–8.3)

## 2014-08-07 LAB — HEMOGLOBIN A1C
Hgb A1c MFr Bld: 6.5 % — ABNORMAL HIGH (ref <5.7)
Mean Plasma Glucose: 140 mg/dL — ABNORMAL HIGH (ref <117)

## 2014-08-10 ENCOUNTER — Telehealth: Payer: Self-pay

## 2014-08-10 MED ORDER — METFORMIN HCL 500 MG PO TABS: 500.0000 mg | ORAL_TABLET | Freq: Every day | ORAL | Status: DC

## 2014-08-10 MED ORDER — POTASSIUM CHLORIDE ER 10 MEQ PO TBCR: 10.0000 meq | EXTENDED_RELEASE_TABLET | Freq: Every day | ORAL | Status: DC

## 2014-08-10 NOTE — Telephone Encounter (Signed)
Patient not available Message states phone does not accept incoming calls Unable to leave message

## 2014-08-10 NOTE — Telephone Encounter (Signed)
-----   Message from Doris Cheadleeepak Advani, MD sent at 08/10/2014  9:41 AM EDT ----- Blood work reviewed  potassium is low ( likely secondary to diuretic medication chlorthalidone) , advise patient start  Taking  KCL 10 mEq daily . Will repeat the  blood chemistry on the following visit. Also his hemoglobin A1c has trended up and is now 6.5%, patient has diabetes, advise patient for low carbohydrate diet and start taking metformin 500 mg daily. Will repeat A1c in 3 months.

## 2014-08-11 ENCOUNTER — Emergency Department: Payer: Self-pay

## 2014-08-11 ENCOUNTER — Ambulatory Visit: Payer: Self-pay | Admitting: Cardiology

## 2014-08-11 ENCOUNTER — Emergency Department
Admission: EM | Admit: 2014-08-11 | Discharge: 2014-08-11 | Disposition: A | Discharge status: Home / Self Care | Payer: Self-pay | Attending: Emergency Medicine | Admitting: Emergency Medicine

## 2014-08-11 ENCOUNTER — Encounter: Payer: Self-pay | Admitting: *Deleted

## 2014-08-11 DIAGNOSIS — Z7982 Long term (current) use of aspirin: Secondary | ICD-10-CM | POA: Insufficient documentation

## 2014-08-11 DIAGNOSIS — I10 Essential (primary) hypertension: Secondary | ICD-10-CM | POA: Insufficient documentation

## 2014-08-11 DIAGNOSIS — Z79899 Other long term (current) drug therapy: Secondary | ICD-10-CM | POA: Insufficient documentation

## 2014-08-11 DIAGNOSIS — M10071 Idiopathic gout, right ankle and foot: Secondary | ICD-10-CM | POA: Insufficient documentation

## 2014-08-11 LAB — URIC ACID: Uric Acid, Serum: 14.2 mg/dL — ABNORMAL HIGH (ref 4.4–7.6)

## 2014-08-11 MED ORDER — KETOROLAC TROMETHAMINE 10 MG PO TABS
20.0000 mg | ORAL_TABLET | Freq: Once | ORAL | Status: AC
Start: 2014-08-11 — End: 2014-08-11
  Administered 2014-08-11: 20 mg via ORAL

## 2014-08-11 MED ORDER — HYDROCODONE-ACETAMINOPHEN 5-325 MG PO TABS: 1.0000 | ORAL_TABLET | ORAL | Status: DC | PRN

## 2014-08-11 MED ORDER — KETOROLAC TROMETHAMINE 10 MG PO TABS: 10.0000 mg | ORAL_TABLET | Freq: Three times a day (TID) | ORAL | Status: DC

## 2014-08-11 MED ORDER — KETOROLAC TROMETHAMINE 10 MG PO TABS
ORAL_TABLET | ORAL | Status: AC
  Administered 2014-08-11: 20 mg via ORAL
  Filled 2014-08-11: qty 2

## 2014-08-11 NOTE — ED Notes (Addendum)
Pt reports he woke up this morning with pain in his right foot, denies injury. Pt says it feels warm, swelling,  and like someone is squeezing his foot. Pain radiates up through the side/back of his ankle/leg

## 2014-08-11 NOTE — Discharge Instructions (Signed)

## 2014-08-11 NOTE — ED Provider Notes (Signed)
[facility] Emergency Department Provider Note ?____________________________________________ ? Time seen: 2005 ? I have reviewed the triage vital signs and the nursing notes. ________ HISTORY ? Chief Complaint Foot Pain  HPI  Justin Liu is a 39 y.o. male who reports to the ED with dorsal right foot pain since this morning. He denies any significant injury, trauma, or contusion. He reports pain to the bottom and top of the midfoot on the right, describing what he calls pressure around the foot. He does have a history of gout, but denies that this pain is similar to that. He has dose of ibuprofen today for pain without significant improvement.  Past Medical History  Diagnosis Date  . Asthma   . Irregular heartbeat   . Hypertension     Patient Active Problem List   Diagnosis Date Noted  . Nail bed injury 07/30/2014  . Essential hypertension 05/31/2014  . Family history of diabetes mellitus (DM) 05/31/2014  . Blurry vision 05/31/2014  . Accelerated hypertension 05/05/2014   ? History reviewed. No pertinent past surgical history. ? Current Outpatient Rx  Name  Route  Sig  Dispense  Refill  . albuterol (PROVENTIL HFA;VENTOLIN HFA) 108 (90 BASE) MCG/ACT inhaler   Inhalation   Inhale 2 puffs into the lungs every 4 (four) hours as needed for wheezing or shortness of breath.   1 Inhaler   0   . amLODipine (NORVASC) 10 MG tablet   Oral   Take 1 tablet (10 mg total) by mouth daily. Take every morning.   90 tablet   1   . aspirin EC 81 MG tablet   Oral   Take 81 mg by mouth daily.         . chlorthalidone (HYGROTON) 25 MG tablet   Oral   Take 1 tablet (25 mg total) by mouth daily. Take every morning   90 tablet   1   . cromolyn (NASALCROM) 5.2 MG/ACT nasal spray   Each Nare   Place 1 spray into both nostrils daily.         Marland Kitchen. EXPIRED: fluticasone (FLONASE) 50 MCG/ACT nasal spray   Nasal   Place 2 sprays into the nose daily.   16  g   0   . HYDROcodone-acetaminophen (NORCO) 5-325 MG per tablet   Oral   Take 1 tablet by mouth every 4 (four) hours as needed for moderate pain.   6 tablet   0   . ibuprofen (ADVIL,MOTRIN) 600 MG tablet   Oral   Take 1 tablet (600 mg total) by mouth every 8 (eight) hours as needed.   30 tablet   1   . ketorolac (TORADOL) 10 MG tablet   Oral   Take 1 tablet (10 mg total) by mouth every 8 (eight) hours.   15 tablet   0   . metFORMIN (GLUCOPHAGE) 500 MG tablet   Oral   Take 1 tablet (500 mg total) by mouth daily.   30 tablet   2   . potassium chloride (K-DUR) 10 MEQ tablet   Oral   Take 1 tablet (10 mEq total) by mouth daily.   30 tablet   0   . potassium chloride SA (K-DUR,KLOR-CON) 20 MEQ tablet   Oral   Take 1 tablet (20 mEq total) by mouth daily.   10 tablet   0   . Vitamin D, Ergocalciferol, (DRISDOL) 50000 UNITS CAPS capsule   Oral   Take 1 capsule (50,000 Units total)  by mouth every 7 (seven) days.   12 capsule   0   ? Allergies Lisinopril ? Family History  Problem Relation Age of Onset  . Hypertension Mother   . Stroke Mother   . Hypertension Father   . Diabetes Father   . Heart disease Father   . Heart attack Paternal Uncle   . Heart attack Paternal Grandmother   . Heart disease Paternal Grandmother   . Heart attack Paternal Grandfather   . Heart disease Paternal Grandfather   ? Social History History  Substance Use Topics  . Smoking status: Former Smoker -- 10 years    Quit date: 04/29/2008  . Smokeless tobacco: Not on file  . Alcohol Use: No   Review of Systems  Constitutional: Negative for fever. HEENT: Negative for head trauma, visual changes, sore throat. Cardiovascular: Negative for chest pain. Respiratory: Negative for shortness of breath. Musculoskeletal: Negative for back pain. Positive for foot pain Skin: Negative for rash. Neurological: Negative for headaches, focal weakness or numbness.  10-point ROS otherwise  negative. ____________________________________________  PHYSICAL EXAM:  VITAL SIGNS: ED Triage Vitals  Enc Vitals Group     BP 08/11/14 1813 133/99 mmHg     Pulse Rate 08/11/14 1813 76     Resp 08/11/14 1813 20     Temp 08/11/14 1813 98.4 F (36.9 C)     Temp Source 08/11/14 1813 Oral     SpO2 08/11/14 1813 95 %     Weight 08/11/14 1813 260 lb (117.935 kg)     Height 08/11/14 1813  (1.854 m)     Head Cir --      Peak Flow --      Pain Score 08/11/14 1814 10     Pain Loc --      Pain Edu? --      Excl. in GC? --    Constitutional: Alert and oriented. Well appearing and in no distress. HEENT:Normocephalic and atraumatic.  PERRL. Normal extraocular movements.  No congestion/rhinnorhea. Mucous membranes are moist. Neck: Supple. No cervical lymphadenopathy. Cardiovascular: Normal rate, regular rhythm. No murmurs, rubs, or gallops. Normal and symmetric distal pulses are present in right foot. Respiratory: Normal respiratory effort without tachypnea. Breath sounds are clear and equal bilaterally. No wheezes/rales/rhonchi. Gastrointestinal: Soft and nontender. No distention. No abdominal bruits. There is no CVA tenderness. Musculoskeletal: Nontender with normal range of motion in all extremities. No joint effusions.  Right foot without deformity, erythema or edema, but tenderness noted to palp dorsally.  Neurologic:  Normal speech and language. CN II-XII grossly intact. No gait instability. Skin:  Skin is warm, dry and intact. No rash noted. Psychiatric: Mood and affect are normal. Patient exhibits appropriate insight and judgment. ___________ RADIOLOGY  Right Foot IMPRESSION: Negative. _____________ PROCEDURES Toradol  PO ______________________________________________________ INITIAL IMPRESSION / ASSESSMENT AND PLAN / ED COURSE ? Acute midfoot pain which is likely a gout flare.  Prescription Toradol & Norco for pain relief. Cast shoe for comfort.   ____________________________________________ FINAL CLINICAL IMPRESSION(S) / ED DIAGNOSES?  Final diagnoses:  Acute idiopathic gout of right foot      Lissa Hoard, PA-C 08/11/14 2200  Sharman Cheek, MD 08/11/14 [phone]

## 2014-08-11 NOTE — ED Notes (Signed)
States he woke up with pain to right foot this am. No injury describes pain as throbbing.

## 2014-08-11 NOTE — ED Notes (Signed)
Pt presents to ED with c/o right foot pain since this morning. Pt denies any known injury. Pt reports pain on the bottom, top, and sides of the right foot. Pt denies any pain in the left foot.

## 2014-08-18 ENCOUNTER — Ambulatory Visit: Payer: Self-pay | Admitting: Internal Medicine

## 2014-09-01 ENCOUNTER — Ambulatory Visit: Payer: Self-pay | Admitting: Cardiology

## 2014-11-10 ENCOUNTER — Ambulatory Visit: Payer: Self-pay | Admitting: Cardiology

## 2015-01-12 ENCOUNTER — Ambulatory Visit: Payer: Self-pay | Admitting: Cardiology

## 2015-01-19 ENCOUNTER — Ambulatory Visit (HOSPITAL_BASED_OUTPATIENT_CLINIC_OR_DEPARTMENT_OTHER): Payer: Self-pay | Admitting: Cardiology

## 2015-01-19 ENCOUNTER — Encounter: Payer: Self-pay | Admitting: Cardiology

## 2015-01-19 DIAGNOSIS — I10 Essential (primary) hypertension: Secondary | ICD-10-CM

## 2015-01-19 LAB — LIPID PANEL
CHOL/HDL RATIO: 6.3 ratio — AB (ref <=5.0)
Cholesterol: 184 mg/dL (ref 125–200)
HDL: 29 mg/dL — AB (ref >=40)
LDL Cholesterol: 138 mg/dL — ABNORMAL HIGH (ref <130)
Triglycerides: 87 mg/dL (ref <150)
VLDL: 17 mg/dL (ref <30)

## 2015-01-19 NOTE — Progress Notes (Signed)
Pt here for a F/U for accelerated hypertension. Pt denies any pain today. Pt reports he has been experiencing flutters on the left side of his chest which has occurred for about a couple of weeks now and it comes and goes. Pt stated he bent over once  And had a sensation like a spasm on the left side of his chest, but went away after stretching. Pt has taken his medications today. Pt needs a refill on his albuterol inhaler. Pt denies any chest pain,SOB, and wheezing. Pt has been experiencing swelling in both of his lower legs. Pt has noticed this for a couple of weeks now.

## 2015-01-26 ENCOUNTER — Ambulatory Visit: Payer: Self-pay | Attending: Cardiology | Admitting: Cardiology

## 2015-01-26 ENCOUNTER — Encounter: Payer: Self-pay | Admitting: Cardiology

## 2015-01-26 DIAGNOSIS — Z79899 Other long term (current) drug therapy: Secondary | ICD-10-CM | POA: Insufficient documentation

## 2015-01-26 DIAGNOSIS — I10 Essential (primary) hypertension: Secondary | ICD-10-CM | POA: Insufficient documentation

## 2015-01-26 DIAGNOSIS — R0789 Other chest pain: Secondary | ICD-10-CM | POA: Insufficient documentation

## 2015-01-26 MED ORDER — LOSARTAN POTASSIUM 50 MG PO TABS: 50.0000 mg | ORAL_TABLET | Freq: Every day | ORAL | Status: DC

## 2015-01-26 NOTE — Progress Notes (Signed)
Justin Liu returns today for evaluation and management of his hypertension and LVH on his EKG.  He's been compliant with his medications and his diet. He is walking on a regular basis. He may have lost a few pounds.  He has had one episode of a tight pressure in his chest that felt like a brick. 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 He has never had this or any other symptoms with activity including walking.  He's been a lot of stress having lost his job in October.  He did take his medications this morning. His blood pressure is borderline high. He runs the same way at home.  Also note that his potassium was 2.9 in May with blood work. He is supposed to be taking potassium but has not been.  His exam today shows him to be in no acute distress. Neck shows no obvious JVD. Lungs are clear to auscultation. Heart reveals a poorly appreciated PMI with a normal S1-S2 with a soft systolic murmur at the left lower sternal border. There is no gallop. Abdominal exam is soft good bowel sounds. Extremities no edema. Pulses are present.

## 2015-01-26 NOTE — Assessment & Plan Note (Signed)
His blood pressure still not at goal. I have placed him on losartan 50 mg a day and continue the chlorthalidone and amlodipine. I've encouraged him to monitor his salt intake. He'll continue his walking routine. We'll have him return in a week on all 3 medications and have a blood pressure check and a metabolic profile. I've asked him not to take potassium supplement at this time. His chest discomfort does not sound cardiac and I spent a long time answering questions and reassuring him. Assuming his blood pressures under good control, I will see him back in 6 months.

## 2015-01-26 NOTE — Patient Instructions (Addendum)
Thank you for visiting with Dr. Daleen SquibbWall today. Metformin has been discontinued from your medication list. Begin taking Losartan 50 mg once daily. Return for a BP check in 2 weeks after beginning Losartan. Return for a Lab visit in 2 weeks after beginning Losartan for a BMET check. Increase Potassium intake with tomatoes, bananas, sweet potatoes and spinach.

## 2015-01-31 ENCOUNTER — Telehealth: Payer: Self-pay | Admitting: *Deleted

## 2015-01-31 NOTE — Telephone Encounter (Signed)
Patient verified DOB Patient advised of implementing a low cholesterol and low fat diet. Patient informed of not needing medication at this time, test will be repeated in one year. Patient had no further questions.

## 2015-01-31 NOTE — Telephone Encounter (Signed)
-----   Message from Gaylord Shihhomas C Wall, MD sent at 01/26/2015  9:44 AM EST ----- Emphasize low-cholesterol low-fat diet. He does not need medication for this. Repeat in one year.

## 2015-02-07 ENCOUNTER — Telehealth: Payer: Self-pay | Admitting: Internal Medicine

## 2015-02-07 NOTE — Telephone Encounter (Signed)
Pt states that he was recently put on Losartan by Dr. Daleen SquibbWall and thinks that this is giving him gout. Please advise pt at the earliest convenience as to what next steps should be taken. Thank you, Dorothey BasemanSadie Reynolds, ASA

## 2015-02-08 ENCOUNTER — Telehealth: Payer: Self-pay

## 2015-02-08 NOTE — Telephone Encounter (Signed)
Patient returned phone call, please f/u with pt.    °

## 2015-02-08 NOTE — Telephone Encounter (Signed)
Returned patient phone call Patient not available Message left on voice mail to return our call 

## 2015-02-09 ENCOUNTER — Other Ambulatory Visit: Payer: Self-pay

## 2015-02-10 ENCOUNTER — Ambulatory Visit: Payer: Self-pay

## 2015-02-10 ENCOUNTER — Encounter: Payer: Self-pay | Admitting: Family Medicine

## 2015-02-10 ENCOUNTER — Ambulatory Visit (HOSPITAL_BASED_OUTPATIENT_CLINIC_OR_DEPARTMENT_OTHER): Payer: Self-pay | Admitting: Family Medicine

## 2015-02-10 ENCOUNTER — Ambulatory Visit: Payer: Self-pay | Attending: Family Medicine | Admitting: Pharmacist

## 2015-02-10 VITALS — BP 133/85 | HR 71 | Temp 98.1°F | Resp 16 | Ht 72.0 in | Wt 272.0 lb

## 2015-02-10 VITALS — BP 122/84 | HR 70

## 2015-02-10 DIAGNOSIS — I10 Essential (primary) hypertension: Secondary | ICD-10-CM | POA: Insufficient documentation

## 2015-02-10 DIAGNOSIS — M109 Gout, unspecified: Secondary | ICD-10-CM | POA: Insufficient documentation

## 2015-02-10 DIAGNOSIS — M10072 Idiopathic gout, left ankle and foot: Secondary | ICD-10-CM

## 2015-02-10 LAB — BASIC METABOLIC PANEL
BUN: 17 mg/dL (ref 7–25)
CALCIUM: 9.6 mg/dL (ref 8.6–10.3)
CO2: 31 mmol/L (ref 20–31)
Chloride: 97 mmol/L — ABNORMAL LOW (ref 98–110)
Creat: 1.24 mg/dL (ref 0.60–1.35)
GLUCOSE: 107 mg/dL — AB (ref 65–99)
Potassium: 3.4 mmol/L — ABNORMAL LOW (ref 3.5–5.3)
Sodium: 138 mmol/L (ref 135–146)

## 2015-02-10 MED ORDER — PREDNISONE 20 MG PO TABS: 40.0000 mg | ORAL_TABLET | Freq: Every day | ORAL | Status: DC

## 2015-02-10 MED ORDER — COLCHICINE 0.6 MG PO TABS: ORAL_TABLET | ORAL | Status: DC

## 2015-02-10 MED ORDER — LOSARTAN POTASSIUM 100 MG PO TABS: 100.0000 mg | ORAL_TABLET | Freq: Every day | ORAL | Status: DC

## 2015-02-10 MED ORDER — KETOROLAC TROMETHAMINE 60 MG/2ML IM SOLN
60.0000 mg | Freq: Once | INTRAMUSCULAR | Status: AC
  Administered 2015-02-10: 60 mg via INTRAMUSCULAR

## 2015-02-10 NOTE — Progress Notes (Signed)
Pt's here for Gout in left foot. Pt reports that he's been having issues with Gout flare-up 3x's this year. Pt reports pain 10/10 described as a pulsating, sharp excruciating pain.   Pt states there's a lump on top of his left foot. Patient asking for samples of the Gout meds if our Pharmacy as it.  Pt was Dx with HTN.

## 2015-02-10 NOTE — Patient Instructions (Addendum)
Thank you for coming to see me today!  Your blood pressure looks great! But let's stop the chlorthalidone to see if that helps with the gout.  Start taking losartan 100 mg daily (you can take 2 tablets of the 50 mg for what you have left).  Come back and see me in two weeks for a blood pressure check  Gout Gout is an inflammatory arthritis caused by a buildup of uric acid crystals in the joints. Uric acid is a chemical that is normally present in the blood. When the level of uric acid in the blood is too high it can form crystals that deposit in your joints and tissues. This causes joint redness, soreness, and swelling (inflammation). Repeat attacks are common. Over time, uric acid crystals can form into masses (tophi) near a joint, destroying bone and causing disfigurement. Gout is treatable and often preventable. CAUSES  The disease begins with elevated levels of uric acid in the blood. Uric acid is produced by your body when it breaks down a naturally found substance called purines. Certain foods you eat, such as meats and fish, contain high amounts of purines. Causes of an elevated uric acid level include:  Being passed down from parent to child (heredity).  Diseases that cause increased uric acid production (such as obesity, psoriasis, and certain cancers).  Excessive alcohol use.  Diet, especially diets rich in meat, beer, wine and seafood.  Medicines, including certain cancer-fighting medicines (chemotherapy), water pills (diuretics), and aspirin.  Chronic kidney disease. The kidneys are no longer able to remove uric acid well.  Problems with metabolism. Conditions strongly associated with gout include:  Obesity.  High blood pressure.  High cholesterol.  Diabetes. Not everyone with elevated uric acid levels gets gout. It is not understood why some people get gout and others do not. Surgery, joint injury, and eating too much of certain foods are some of the factors that can  lead to gout attacks. SYMPTOMS   An attack of gout comes on quickly. It causes intense pain with redness, swelling, and warmth in a joint.  Fever can occur.  Often, only one joint is involved. Certain joints are more commonly involved:  Base of the big toe.  Knee.  Ankle.  Wrist.  Finger. Without treatment, an attack usually goes away in a few days to weeks. Between attacks, you usually will not have symptoms, which is different from many other forms of arthritis. DIAGNOSIS  Your caregiver will suspect gout based on your symptoms and exam. In some cases, tests may be recommended. The tests may include:  Blood tests.  Urine tests.  X-rays.  Joint fluid exam. This exam requires a needle to remove fluid from the joint (arthrocentesis). Using a microscope, gout is confirmed when uric acid crystals are seen in the joint fluid. TREATMENT  There are two phases to gout treatment: treating the sudden onset (acute) attack and preventing attacks (prophylaxis).  Treatment of an Acute Attack.  Medicines are used. These include anti-inflammatory medicines or steroid medicines.  An injection of steroid medicine into the affected joint is sometimes necessary.  The painful joint is rested. Movement can worsen the arthritis.  You may use warm or cold treatments on painful joints, depending which works best for you.  Treatment to Prevent Attacks.  If you suffer from frequent gout attacks, your caregiver may advise preventive medicine. These medicines are started after the acute attack subsides. These medicines either help your kidneys eliminate uric acid from your body or  decrease your uric acid production. You may need to stay on these medicines for a very long time.  The early phase of treatment with preventive medicine can be associated with an increase in acute gout attacks. For this reason, during the first few months of treatment, your caregiver may also advise you to take medicines  usually used for acute gout treatment. Be sure you understand your caregiver's directions. Your caregiver may make several adjustments to your medicine dose before these medicines are effective.  Discuss dietary treatment with your caregiver or dietitian. Alcohol and drinks high in sugar and fructose and foods such as meat, poultry, and seafood can increase uric acid levels. Your caregiver or dietitian can advise you on drinks and foods that should be limited. HOME CARE INSTRUCTIONS   Do not take aspirin to relieve pain. This raises uric acid levels.  Only take over-the-counter or prescription medicines for pain, discomfort, or fever as directed by your caregiver.  Rest the joint as much as possible. When in bed, keep sheets and blankets off painful areas.  Keep the affected joint raised (elevated).  Apply warm or cold treatments to painful joints. Use of warm or cold treatments depends on which works best for you.  Use crutches if the painful joint is in your leg.  Drink enough fluids to keep your urine clear or pale yellow. This helps your body get rid of uric acid. Limit alcohol, sugary drinks, and fructose drinks.  Follow your dietary instructions. Pay careful attention to the amount of protein you eat. Your daily diet should emphasize fruits, vegetables, whole grains, and fat-free or low-fat milk products. Discuss the use of coffee, vitamin C, and cherries with your caregiver or dietitian. These may be helpful in lowering uric acid levels.  Maintain a healthy body weight. SEEK MEDICAL CARE IF:   You develop diarrhea, vomiting, or any side effects from medicines.  You do not feel better in 24 hours, or you are getting worse. SEEK IMMEDIATE MEDICAL CARE IF:   Your joint becomes suddenly more tender, and you have chills or a fever. MAKE SURE YOU:   Understand these instructions.  Will watch your condition.  Will get help right away if you are not doing well or get worse.    This information is not intended to replace advice given to you by your health care provider. Make sure you discuss any questions you have with your health care provider.   Document Released: 02/24/2000 Document Revised: 03/19/2014 Document Reviewed: 10/10/2011 Elsevier Interactive Patient Education [employer]

## 2015-02-10 NOTE — Progress Notes (Signed)
S:    Patient arrives in a lot of pain from acute gout attack.    Presents to the clinic for hypertension evaluation.   Patient reports adherence with medications.  Current BP Medications include:  Amlodipine 10 mg daily, losartan 50 mg daily, and chlorthalidone 25 mg daily.   Antihypertensives tried in the past include: lisinopril (caused hives)  Dietary habits include: patient has been working hard to eat better and cut out salt after seeing Dr. Daleen SquibbWall.   Patient reports acute gout attack. He thinks that it is the losartan that caused it. He has been keeping a food diary and has avoided alcohol, meat (except chicken and [place]), and cheese. He denies having any medications for gout.   He also would like to know if any of his medications would impact his ability to have sex. He denies any issues right now but would like to know for future reference.    O:   Last 3 Office BP readings: BP Readings from Last 3 Encounters:  02/10/15 133/85  02/10/15 122/84  01/19/15 136/98    BMET    Component Value Date/Time   NA 139 08/06/2014 1228   NA 139 10/16/2013 2029   K 2.9* 08/06/2014 1228   K 4.3 10/16/2013 2029   CL 94* 08/06/2014 1228   CL 104 10/16/2013 2029   CO2 29 08/06/2014 1228   CO2 31 10/16/2013 2029   GLUCOSE 124* 08/06/2014 1228   GLUCOSE 99 10/16/2013 2029   BUN 26* 08/06/2014 1228   BUN 13 10/16/2013 2029   CREATININE 1.34 08/06/2014 1228   CREATININE 1.32 04/29/2014 1612   CREATININE 1.29 10/16/2013 2029   CALCIUM 9.2 08/06/2014 1228   CALCIUM 8.9 10/16/2013 2029   GFRNONAA 66 08/06/2014 1228   GFRNONAA 67* 04/29/2014 1612   GFRNONAA >60 10/16/2013 2029   GFRAA 77 08/06/2014 1228   GFRAA 77* 04/29/2014 1612   GFRAA >60 10/16/2013 2029    A/P: History of hypertension currently controlled on current medications. However, I think that the chlorthalidone could be contributing to the increase in gout attacks. Will discontinue chlorthalidone and increase losartan  to 100 mg daily. Of note, losartan has uric acid lowering properties and is likely benefiting him and not causing gout attacks. Also, it is unlikely that any of his medications would impact his sexual activity. Provided this education to patient. Patient verbalized understanding.   Will have patient schedule same day appointment with Dr. Venetia NightAmao for evaluation of his gout.   Results reviewed and written information provided.   Total time in face-to-face counseling 20 minutes.  F/U Clinic Visit with me in 2 weeks.

## 2015-02-10 NOTE — Progress Notes (Signed)
Subjective:  Patient ID: Justin Liu, male    DOB: 12-31-1975  Age: 39 y.o. MRN: [id number]  CC: Gout   HPI Justin Liu presents for acute gout flare of the left foot which he has had for the last 1 week.  He was diagnosed with gout in 08/2014 with a uric acid level of 14.2 and has not been on any medications but states he has had about 3 gout flares since diagnosis. He reports avoiding seafood and other gout triggers. Today he complains of pain in the dorsum of his left foot which is a 10/10 with associated swelling and denies any recent ingestion of gout triggering foods and does not drink alcohol.  Of note he was on chlorthalidone which was discontinued today at his blood pressure check with the pharmacist.  Outpatient Prescriptions Prior to Visit  Medication Sig Dispense Refill  . albuterol (PROVENTIL HFA;VENTOLIN HFA) 108 (90 BASE) MCG/ACT inhaler Inhale 2 puffs into the lungs every 4 (four) hours as needed for wheezing or shortness of breath. 1 Inhaler 0  . amLODipine (NORVASC) 10 MG tablet Take 1 tablet (10 mg total) by mouth daily. Take every morning. 90 tablet 1  . aspirin EC 81 MG tablet Take 81 mg by mouth daily.    . cromolyn (NASALCROM) 5.2 MG/ACT nasal spray Place 1 spray into both nostrils daily.    Marland Kitchen ibuprofen (ADVIL,MOTRIN) 600 MG tablet Take 1 tablet (600 mg total) by mouth every 8 (eight) hours as needed. 30 tablet 1  . losartan (COZAAR) 100 MG tablet Take 1 tablet (100 mg total) by mouth daily. 90 tablet 3  . chlorthalidone (HYGROTON) 25 MG tablet Take 1 tablet (25 mg total) by mouth daily. Take every morning 90 tablet 1  . losartan (COZAAR) 50 MG tablet Take 1 tablet (50 mg total) by mouth daily. 90 tablet 3   No facility-administered medications prior to visit.    ROS Review of Systems  Constitutional: Negative for activity change and appetite change.  HENT: Negative for sinus pressure and sore throat.   Eyes: Negative for visual disturbance.   Respiratory: Negative for cough, chest tightness and shortness of breath.   Cardiovascular: Negative for chest pain and leg swelling.  Gastrointestinal: Negative for abdominal pain, diarrhea, constipation and abdominal distention.  Endocrine: Negative.   Genitourinary: Negative for dysuria.  Musculoskeletal:       See history of present illness  Skin: Negative for rash.  Allergic/Immunologic: Negative.   Neurological: Negative for weakness, light-headedness and numbness.  Psychiatric/Behavioral: Negative for suicidal ideas and dysphoric mood.    Objective:  BP 133/85 mmHg  Pulse 71  Temp(Src) 98.1 F (36.7 C) (Oral)  Resp 16  Ht 6' (1.829 m)  Wt 272 lb (123.378 kg)  BMI 36.88 kg/m2  SpO2 99%  BP/Weight 02/10/2015 02/10/2015 01/19/2015  Systolic BP 133 122 136  Diastolic BP 85 84 98  Wt. (Lbs) 272 - 270  BMI 36.88 - 36.61    Lab Results  Component Value Date   WBC 6.9 04/29/2014   HGB 14.1 04/29/2014   HCT 42.4 04/29/2014   PLT 242 04/29/2014   GLUCOSE 124* 08/06/2014   CHOL 184 01/19/2015   TRIG 87 01/19/2015   HDL 29* 01/19/2015   LDLCALC 138* 01/19/2015   ALT 25 08/06/2014   AST 23 08/06/2014   NA 139 08/06/2014   K 2.9* 08/06/2014   CL 94* 08/06/2014   CREATININE 1.34 08/06/2014   BUN 26* 08/06/2014  CO2 29 08/06/2014   HGBA1C 6.5* 08/06/2014    Physical Exam  Constitutional: He is oriented to person, place, and time. He appears well-developed and well-nourished.  Cardiovascular: Normal rate, normal heart sounds and intact distal pulses.   No murmur heard. Pulmonary/Chest: Effort normal and breath sounds normal. He has no wheezes. He has no rales. He exhibits no tenderness.  Abdominal: Soft. Bowel sounds are normal. He exhibits no distension and no mass. There is no tenderness.  Musculoskeletal: He exhibits edema (mild edema of dorsum of left foot) and tenderness (Tenderness to palpation of left loss dorsum).  Neurological: He is alert and oriented to  person, place, and time.     Assessment & Plan:   1. Essential hypertension Controlled continue amlodipine and losartan.  2. Acute gout of left foot, unspecified cause We have discussed medications for gout prophylaxis and medications for gout triggers and at this time he is holding off on allopurinol and would like to use on the colchicine for gout flares. We'll see at his next visit if there is a more frequent and determine if we need to initiate allopurinol. Advised to abstain from. Trigger gout. - ketorolac (TORADOL) injection 60 mg; Inject 2 mLs (60 mg total) into the muscle once. - colchicine 0.6 MG tablet; Take 2 tablets (1.2 mg) by mouth at the onset of a gout attack, may repeat one tab in 2 hours if symptoms persist.  Dispense: 30 tablet; Refill: 1 - predniSONE (DELTASONE) 20 MG tablet; Take 2 tablets (40 mg total) by mouth daily with breakfast.  Dispense: 10 tablet; Refill: 0   Meds ordered this encounter  Medications  . ketorolac (TORADOL) injection 60 mg    Sig:   . colchicine 0.6 MG tablet    Sig: Take 2 tablets (1.2 mg) by mouth at the onset of a gout attack, may repeat one tab in 2 hours if symptoms persist.    Dispense:  30 tablet    Refill:  1  . predniSONE (DELTASONE) 20 MG tablet    Sig: Take 2 tablets (40 mg total) by mouth daily with breakfast.    Dispense:  10 tablet    Refill:  0    Follow-up: Return in about 1 month (around 03/13/2015), or if symptoms worsen or fail to improve, for Follow-up on gout..   This note has been created with Education officer, environmentalDragon speech recognition software and smart phrase technology. Any transcriptional errors are unintentional.     Jaclyn ShaggyEnobong Amao MD

## 2015-02-14 ENCOUNTER — Telehealth: Payer: Self-pay

## 2015-02-14 NOTE — Telephone Encounter (Signed)
Returned patient phone call Patient not available Message left on voice mail to return our call 

## 2015-02-24 ENCOUNTER — Ambulatory Visit: Payer: Self-pay | Attending: Family Medicine | Admitting: Pharmacist

## 2015-02-24 VITALS — BP 132/78 | HR 90

## 2015-02-24 DIAGNOSIS — Z79899 Other long term (current) drug therapy: Secondary | ICD-10-CM | POA: Insufficient documentation

## 2015-02-24 DIAGNOSIS — I10 Essential (primary) hypertension: Secondary | ICD-10-CM | POA: Insufficient documentation

## 2015-02-24 MED ORDER — ALBUTEROL SULFATE HFA 108 (90 BASE) MCG/ACT IN AERS: 2.0000 | INHALATION_SPRAY | RESPIRATORY_TRACT | Status: DC | PRN

## 2015-02-24 NOTE — Patient Instructions (Addendum)
Thanks for coming to see me today!  Continue amlodipine 10 mg daily and losartan 100 mg daily for your blood pressure  I sent in the prescription for the inhaler - if your breathing doesn't get better soon - come back and see us!  Schedule an appointment for January 2017 with a new primary care doctor since [name] is gone

## 2015-02-24 NOTE — Progress Notes (Signed)
S:    Patient arrives in good spirits with his wife. Presents to the clinic for hypertension evaluation.   Patient reports adherence with medications.  Current BP Medications include:  Amlodipine 10 mg daily, losartan 100 mg daily  Antihypertensives tried in the past include: lisinopril (caused hives), chlorthalidone (gout)  Dietary habits include: no changes  Patient reports his gout attack has resolved. He reports feeling very well after the medication adjustments from the last visit.   He asks if he can have a new inhaler ordered. He has used all of the albuterol that he had at home and has had some wheezing with the change in weather. He would also like to get a CPAP machine - he reports getting a sleep study a few years ago.   O:   Last 3 Office BP readings: BP Readings from Last 3 Encounters:  02/24/15 132/78  02/10/15 133/85  02/10/15 122/84    BMET    Component Value Date/Time   NA 138 02/10/2015 0904   NA 139 10/16/2013 2029   K 3.4* 02/10/2015 0904   K 4.3 10/16/2013 2029   CL 97* 02/10/2015 0904   CL 104 10/16/2013 2029   CO2 31 02/10/2015 0904   CO2 31 10/16/2013 2029   GLUCOSE 107* 02/10/2015 0904   GLUCOSE 99 10/16/2013 2029   BUN 17 02/10/2015 0904   BUN 13 10/16/2013 2029   CREATININE 1.24 02/10/2015 0904   CREATININE 1.32 04/29/2014 1612   CREATININE 1.29 10/16/2013 2029   CALCIUM 9.6 02/10/2015 0904   CALCIUM 8.9 10/16/2013 2029   GFRNONAA 66 08/06/2014 1228   GFRNONAA 67* 04/29/2014 1612   GFRNONAA >60 10/16/2013 2029   GFRAA 77 08/06/2014 1228   GFRAA 77* 04/29/2014 1612   GFRAA >60 10/16/2013 2029    A/P: History of hypertension currently controlled on current medications. Continue losartan 100 mg daily and amlodipine 10 mg daily. Patient instructed to refill his medications on time to avoid missing any doses. I hope that this medication change will decrease the number of gout attacks.   See 03/25/14 ED note for diagnosis of asthma. Refilled  albuterol inhaler - patient to follow up with new primary care provider for further evaluation of breathing and need for CPAP. Reviewed use of inhaler. Patient verbalized understanding.    Results reviewed and written information provided.   Total time in face-to-face counseling 20 minutes.  F/U Clinic Visit with new primary care provider.

## 2015-03-17 MED FILL — AMLODIPINE BESYLATE 10 MG T: 10 | 30 days supply | Qty: 30 | Fill #3

## 2015-03-17 MED FILL — LOSARTAN POTASSIUM 100 MG T: 100 | 30 days supply | Qty: 30 | Fill #1

## 2015-04-29 MED FILL — LOSARTAN POTASSIUM 100 MG T: 100 | 30 days supply | Qty: 30 | Fill #2

## 2015-04-29 MED FILL — AMLODIPINE BESYLATE 10 MG T: 10 | 30 days supply | Qty: 30 | Fill #4

## 2015-06-01 ENCOUNTER — Other Ambulatory Visit: Payer: Self-pay | Admitting: Cardiology

## 2015-06-01 MED FILL — LOSARTAN POTASSIUM 100 MG T: 100 | 30 days supply | Qty: 30 | Fill #3

## 2015-06-02 MED FILL — AMLODIPINE BESYLATE 10 MG T: 10 | 30 days supply | Qty: 30 | Fill #0

## 2015-06-22 ENCOUNTER — Other Ambulatory Visit: Payer: Self-pay

## 2015-06-22 ENCOUNTER — Encounter: Payer: Self-pay | Admitting: Internal Medicine

## 2015-06-22 ENCOUNTER — Ambulatory Visit: Payer: Self-pay | Attending: Internal Medicine | Admitting: Internal Medicine

## 2015-06-22 VITALS — BP 138/83 | HR 88 | Temp 98.3°F | Resp 18 | Ht 72.0 in | Wt 282.0 lb

## 2015-06-22 DIAGNOSIS — R631 Polydipsia: Secondary | ICD-10-CM | POA: Insufficient documentation

## 2015-06-22 DIAGNOSIS — R079 Chest pain, unspecified: Secondary | ICD-10-CM

## 2015-06-22 DIAGNOSIS — R9431 Abnormal electrocardiogram [ECG] [EKG]: Secondary | ICD-10-CM

## 2015-06-22 DIAGNOSIS — Z833 Family history of diabetes mellitus: Secondary | ICD-10-CM

## 2015-06-22 DIAGNOSIS — E663 Overweight: Secondary | ICD-10-CM | POA: Insufficient documentation

## 2015-06-22 LAB — GLUCOSE, POCT (MANUAL RESULT ENTRY): POC Glucose: 137 mg/dl — AB (ref 70–99)

## 2015-06-22 NOTE — Progress Notes (Signed)
Patient has several  Complaints: 1) chest  Fluttering- he occasionally notes some twitching in his chest- he does use the word "tightness" to describe the discomfort at times. No exerttional pain. He can work hard without difficulty. No breathing trouble.  2) polydipsia- "i am always thirsty". Ongoing for several weeks. Denies poyuria. i have reviewed labs- note mild hyperglycemia previously.   3) overweight- admits to weight gain over past months.   Reviewed fhx- note father with CAD- now requiring heart transplant  Reviewed meds  Ap:  Chest discomfort- check EKG. Does not sound cardiac but he may need treadmill test  EKG with nonspecific t wave abnormalities Given his sxs and fhx i think he needs further eval EKG abnormalities will require him to have imaging stress test. We will set that up  Polyuria- check cbg  Weight- he understands need for aggressive weight loss

## 2015-06-22 NOTE — Progress Notes (Signed)
Patient is here FU for irregular heartbeat  Patient denies pain at this time.  Patient states three days ago he experienced a tremor in his chest. Patient also experienced cramping while bending. Patient states incidents did not coincide.   Patient has taken medications today and patient has eaten.

## 2015-06-22 NOTE — Patient Instructions (Signed)
Discussed with patient- you will have a stress test

## 2015-06-28 ENCOUNTER — Telehealth: Payer: Self-pay | Admitting: Internal Medicine

## 2015-06-28 NOTE — Telephone Encounter (Signed)
Pt. Called stating that on his last OV the provider told him he was going to refer him to had a stress test done. The pt. Has not received a phone call. Please f/u with pt.

## 2015-06-29 NOTE — Telephone Encounter (Signed)
Pt. Called stating that on his last OV the provider told him he was going to refer him to had a stress test done. The pt. Has not received a phone call. Please f/u with pt.  °

## 2015-07-06 NOTE — Telephone Encounter (Signed)
Medical Assistant left message on patient's home and cell voicemail. Voicemail states to give a call back [facility] [place] [facility] at [phone].   !!!Please inform patient of Procedure being scheduled in two parts which is required for his test. Testing will be completed on 07/18/15 at 12:30pm and 07/19/15 at 12:45pm!!! !!!Patient will receive a phone call ONE day prior from a [phone] number!!!

## 2015-07-06 NOTE — Telephone Encounter (Signed)
Pt. Was informed of his appointment for the stress test.

## 2015-07-13 ENCOUNTER — Telehealth (HOSPITAL_COMMUNITY): Payer: Self-pay | Admitting: *Deleted

## 2015-07-13 NOTE — Telephone Encounter (Signed)
Patient given detailed instructions per Myocardial Perfusion Study Information Sheet for the test on 07/18/15 Patient notified to arrive 15 minutes early and that it is imperative to arrive on time for appointment to keep from having the test rescheduled.  If you need to cancel or reschedule your appointment, please call the office within 24 hours of your appointment. Failure to do so may result in a cancellation of your appointment, and a $50 no show fee. Patient verbalized understanding.Ponciano Shealy J Tahjai Schetter, RN  

## 2015-07-18 ENCOUNTER — Ambulatory Visit (HOSPITAL_COMMUNITY): Payer: Self-pay | Attending: Internal Medicine

## 2015-07-18 DIAGNOSIS — I119 Hypertensive heart disease without heart failure: Secondary | ICD-10-CM | POA: Insufficient documentation

## 2015-07-18 DIAGNOSIS — R002 Palpitations: Secondary | ICD-10-CM | POA: Insufficient documentation

## 2015-07-18 DIAGNOSIS — R079 Chest pain, unspecified: Secondary | ICD-10-CM | POA: Insufficient documentation

## 2015-07-18 DIAGNOSIS — Z8249 Family history of ischemic heart disease and other diseases of the circulatory system: Secondary | ICD-10-CM | POA: Insufficient documentation

## 2015-07-18 DIAGNOSIS — R9431 Abnormal electrocardiogram [ECG] [EKG]: Secondary | ICD-10-CM | POA: Insufficient documentation

## 2015-07-18 MED ORDER — REGADENOSON 0.4 MG/5ML IV SOLN
0.4000 mg | Freq: Once | INTRAVENOUS | Status: AC
  Administered 2015-07-18: 0.4 mg via INTRAVENOUS

## 2015-07-18 MED ORDER — TECHNETIUM TC 99M SESTAMIBI GENERIC - CARDIOLITE
32.2000 | Freq: Once | INTRAVENOUS | Status: AC | PRN
  Administered 2015-07-18: 32.2 via INTRAVENOUS

## 2015-07-19 ENCOUNTER — Other Ambulatory Visit: Payer: Self-pay | Admitting: Internal Medicine

## 2015-07-19 ENCOUNTER — Ambulatory Visit (HOSPITAL_COMMUNITY): Payer: Self-pay | Attending: Cardiology

## 2015-07-19 LAB — MYOCARDIAL PERFUSION IMAGING
CHL CUP MPHR: 180 {beats}/min
CHL CUP RESTING HR STRESS: 84 {beats}/min
CHL RATE OF PERCEIVED EXERTION: 19
CSEPEW: 9.2 METS
Exercise duration (min): 7 min
Exercise duration (sec): 30 s
LHR: 0.31
LV dias vol: 171 mL (ref 62–150)
LVSYSVOL: 98 mL
Peak HR: 161 {beats}/min
Percent HR: 89 %
SDS: 4
SRS: 1
SSS: 4
TID: 0.84

## 2015-07-19 MED ORDER — TECHNETIUM TC 99M SESTAMIBI GENERIC - CARDIOLITE
32.2000 | Freq: Once | INTRAVENOUS | Status: AC | PRN
  Administered 2015-07-19: 32.2 via INTRAVENOUS

## 2015-07-19 MED FILL — AMLODIPINE BESYLATE 10 MG T: 10 | 30 days supply | Qty: 30 | Fill #0

## 2015-07-19 MED FILL — LOSARTAN POTASSIUM 100 MG T: 100 | 30 days supply | Qty: 30 | Fill #4

## 2015-08-09 ENCOUNTER — Telehealth: Payer: Self-pay | Admitting: Family Medicine

## 2015-08-09 NOTE — Telephone Encounter (Signed)
Pt. Called stating that he has a stress test done and would Like to know the results. Please f/u with pt.

## 2015-08-23 NOTE — Telephone Encounter (Signed)
Patients procedure has not been reviewed by the provider just yet.

## 2015-09-01 MED FILL — LOSARTAN POTASSIUM 100 MG T: 100 | 30 days supply | Qty: 30 | Fill #5

## 2015-09-01 MED FILL — AMLODIPINE BESYLATE 10 MG T: 10 | 30 days supply | Qty: 30 | Fill #1

## 2015-09-07 ENCOUNTER — Telehealth: Payer: Self-pay | Admitting: Family Medicine

## 2015-09-07 NOTE — Telephone Encounter (Signed)
Pt. Called requesting an appointment with his PCP. Pt. Was asked what he wanted to be seen for and stated he was having Chest pain. Pt. Was advised to go to the ED b/c his PCP did not have any appointments available until July. [facility]. Pt. Was once again advised to go to the ED and he hanged up the phone.

## 2015-09-19 ENCOUNTER — Ambulatory Visit: Payer: Self-pay | Admitting: Family Medicine

## 2015-09-23 ENCOUNTER — Telehealth: Payer: Self-pay | Admitting: Family Medicine

## 2015-09-23 NOTE — Telephone Encounter (Signed)
Pt called about his results on his stress test  Please follow up  Thank you

## 2015-09-27 NOTE — Telephone Encounter (Signed)
Stress tests reveals reduced cardiac output (ejection fraction) however there is no evidence of reduced blood supply to the heart. He will need an office visit for further discussion.

## 2015-09-27 NOTE — Telephone Encounter (Signed)
Routed to PCP to be resulted

## 2015-09-28 ENCOUNTER — Ambulatory Visit: Payer: Self-pay | Admitting: Internal Medicine

## 2015-09-28 ENCOUNTER — Ambulatory Visit: Payer: Self-pay | Admitting: Family Medicine

## 2015-09-30 NOTE — Telephone Encounter (Signed)
Medical Assistant left message on patient's home and cell voicemail. Voicemail states to give a call back to Romelia Bromell with [facility] at [phone].  

## 2015-11-18 MED FILL — AMLODIPINE BESYLATE 10 MG T: 10 | 30 days supply | Qty: 30 | Fill #0

## 2015-11-18 MED FILL — LOSARTAN POTASSIUM 100 MG T: 100 | 30 days supply | Qty: 30 | Fill #0

## 2015-11-30 ENCOUNTER — Ambulatory Visit: Payer: Self-pay | Attending: Internal Medicine | Admitting: Internal Medicine

## 2015-11-30 ENCOUNTER — Encounter: Payer: Self-pay | Admitting: Internal Medicine

## 2015-11-30 VITALS — BP 154/99 | HR 87 | Temp 98.4°F | Resp 22 | Ht 72.0 in | Wt 281.0 lb

## 2015-11-30 DIAGNOSIS — R5383 Other fatigue: Secondary | ICD-10-CM

## 2015-11-30 DIAGNOSIS — I10 Essential (primary) hypertension: Secondary | ICD-10-CM

## 2015-11-30 DIAGNOSIS — G479 Sleep disorder, unspecified: Secondary | ICD-10-CM

## 2015-11-30 NOTE — Progress Notes (Signed)
F/u stress test. R/t chest discomfort. Took BP med one hour ago.  Patient complains of being tired, notes he is overweight- trying to eat better.   He quit smoking 2years ago.   He admits to being tired a lot- can sleep any time. Snores a lot.  His wife tells him he quits sleeping + fhx of OSA.  Past Medical History:  Diagnosis Date  . Asthma   . Hypertension   . Irregular heartbeat     Social History   Social History  . Marital status: Single    Spouse name: N/A  . Number of children: N/A  . Years of education: N/A   Occupational History  . Not on file.   Social History Main Topics  . Smoking status: Former Smoker    Years: 10.00    Quit date: 04/29/2008  . Smokeless tobacco: Not on file  . Alcohol use No  . Drug use: No  . Sexual activity: Not on file   Other Topics Concern  . Not on file   Social History Narrative  . No narrative on file    No past surgical history on file.  Family History  Problem Relation Age of Onset  . Hypertension Mother   . Stroke Mother   . Hypertension Father   . Diabetes Father   . Heart disease Father   . Heart attack Paternal Uncle   . Heart attack Paternal Grandmother   . Heart disease Paternal Grandmother   . Heart attack Paternal Grandfather   . Heart disease Paternal Grandfather     Allergies  Allergen Reactions  . Lisinopril Hives    Current Outpatient Prescriptions on File Prior to Visit  Medication Sig Dispense Refill  . albuterol (PROVENTIL HFA;VENTOLIN HFA) 108 (90 BASE) MCG/ACT inhaler Inhale 2 puffs into the lungs every 4 (four) hours as needed for wheezing or shortness of breath. 1 Inhaler 0  . amLODipine (NORVASC) 10 MG tablet Take 1 tablet (10 mg total) by mouth daily. 30 tablet 2  . aspirin EC 81 MG tablet Take 81 mg by mouth daily.    . cromolyn (NASALCROM) 5.2 MG/ACT nasal spray Place 1 spray into both nostrils daily.    Marland Kitchen. losartan (COZAAR) 100 MG tablet Take 1 tablet (100 mg total) by mouth daily.  90 tablet 3  . ibuprofen (ADVIL,MOTRIN) 600 MG tablet Take 1 tablet (600 mg total) by mouth every 8 (eight) hours as needed. (Patient not taking: Reported on 11/30/2015) 30 tablet 1   No current facility-administered medications on file prior to visit.      patient denies chest pain, shortness of breath, orthopnea. Denies lower extremity edema, abdominal pain, change in appetite, change in bowel movements. Patient denies rashes, musculoskeletal complaints. No other specific complaints in a complete review of systems.   BP (!) 154/99 (BP Location: Left Arm, Patient Position: Sitting, Cuff Size: Large)   Pulse 87   Temp 98.4 F (36.9 C) (Oral)   Resp (!) 22   Ht 6' (1.829 m)   Wt 281 lb (127.5 kg)   SpO2 98%   BMI 38.11 kg/m  ovese male NAD Chest, CTA CV- reg RATE abd- obese extr- no edema.   A/p- Gout- ibuprofen prn Weight loss Increase fluid intake  Fatigue- I suspect he has OSA- sleep study Reviewed stress test- normal  Depressed EF- ? Cause.  I don't think he needs treatment or further evaluation at this time.   HTN- i've asked him to monitor at  home

## 2015-11-30 NOTE — Patient Instructions (Signed)
Concentrate on weight loss  Monitor your blood pressure at home and write down results

## 2015-12-12 ENCOUNTER — Other Ambulatory Visit: Payer: Self-pay | Admitting: Family Medicine

## 2015-12-12 DIAGNOSIS — R29818 Other symptoms and signs involving the nervous system: Secondary | ICD-10-CM

## 2016-01-30 ENCOUNTER — Ambulatory Visit (HOSPITAL_BASED_OUTPATIENT_CLINIC_OR_DEPARTMENT_OTHER): Payer: Self-pay | Attending: Family Medicine | Admitting: Internal Medicine

## 2016-01-30 DIAGNOSIS — G4733 Obstructive sleep apnea (adult) (pediatric): Secondary | ICD-10-CM | POA: Insufficient documentation

## 2016-01-30 DIAGNOSIS — R29818 Other symptoms and signs involving the nervous system: Secondary | ICD-10-CM

## 2016-02-05 DIAGNOSIS — G4730 Sleep apnea, unspecified: Secondary | ICD-10-CM

## 2016-02-05 NOTE — Procedures (Signed)
Patient Name: Justin Liu, Justin Liu Date: 01/30/2016 Gender: Male D.O.B: 05-17-1975 Age (years): 39 Referring Provider: Arnoldo Morale Height (inches): 60 Interpreting Physician: Baird Lyons MD, ABSM Weight (lbs): 250 RPSGT: Carolin Coy BMI: 64 MRN: [id number] Neck Size: 17.50 CLINICAL INFORMATION The patient is referred for a split night study with BPAP. MEDICATIONS Medications self-administered by patient taken the night of the study : none reported  SLEEP STUDY TECHNIQUE As per the AASM Manual for the Scoring of Sleep and Associated Events v2.3 (April 2016) with a hypopnea requiring 4% desaturations. The channels recorded and monitored were frontal, central and occipital EEG, electrooculogram (EOG), submentalis EMG (chin), nasal and oral airflow, thoracic and abdominal wall motion, anterior tibialis EMG, snore microphone, electrocardiogram, and pulse oximetry. Bi-level positive airway pressure (BiPAP) was initiated when the patient met split night criteria and was titrated according to treat sleep-disordered breathing.  RESPIRATORY PARAMETERS Diagnostic Total AHI (/hr): 85.8 RDI (/hr): 85.8 OA Index (/hr): 73.6 CA Index (/hr): 0.4 REM AHI (/hr): 68.6 NREM AHI (/hr): 89.5 Supine AHI (/hr): N/A Non-supine AHI (/hr): 85.78 Min O2 Sat (%): 78.00 Mean O2 (%): 89.76 Time below 88% (min): 55.1   Titration Optimal IPAP Pressure (cm): 25 Optimal EPAP Pressure (cm): 21 AHI at Optimal Pressure (/hr): 3.2 Min O2 at Optimal Pressure (%): 96.0 Sleep % at Optimal (%): 99 Supine % at Optimal (%): 100      SLEEP ARCHITECTURE The study was initiated at 9:50:44 PM and terminated at 5:28:17 AM. The total recorded time was 457.5 minutes. EEG confirmed total sleep time was 379.5 minutes yielding a sleep efficiency of 82.9%. Sleep onset after lights out was 25.3 minutes with a REM latency of 52.5 minutes. The patient spent 34.39% of the night in stage N1 sleep, 34.78% in stage N2 sleep, 0.00% in  stage N3 and 30.83% in REM. Wake after sleep onset (WASO) was 52.7 minutes. The Arousal Index was 48.9/hour.  LEG MOVEMENT DATA The total Periodic Limb Movements of Sleep (PLMS) were 0. The PLMS index was 0.00 .  CARDIAC DATA The 2 lead EKG demonstrated sinus rhythm. The mean heart rate was 69.88 beats per minute. Other EKG findings include: None.  IMPRESSIONS - Severe obstructive sleep apnea occurred during the diagnostic portion of the study (AHI = 85.8 /hour). An optimal BiPAP pressure was selected for this patient ( 25 / 21 cm of water) - CPAP could not provide necessary pressures- converted to BIPAP titration per protocol. - No significant central sleep apnea occurred during the diagnostic portion of the study (CAI = 0.4/hour). - Severe oxygen desaturation was noted during the diagnostic portion of the study (Min O2 = 78.00%). - The patient snored with Moderate snoring volume during the diagnostic portion of the study. - No cardiac abnormalities were noted during this study. - Clinically significant periodic limb movements of sleep did not occur during the study.  DIAGNOSIS - Obstructive Sleep Apnea (327.23 [G47.33 ICD-10])  RECOMMENDATIONS - Trial of BiPAP therapy on 25/21 cm H2O with a Large size Fisher&Paykel Full Face Mask Simplus mask and heated humidification. - Avoid alcohol, sedatives and other CNS depressants that may worsen sleep apnea and disrupt normal sleep architecture. - Sleep hygiene should be reviewed to assess factors that may improve sleep quality. - Weight management and regular exercise should be initiated or continued.  [Electronically signed] 02/05/2016 03:59 PM  Baird Lyons MD, [place], American Board of Sleep Medicine   NPI: [id number]  [place], American Board of Sleep Medicine  ELECTRONICALLY SIGNED ON:  02/05/2016, 3:55 PM [place] PH: [phone]   FX: [phone] [place]

## 2016-02-25 ENCOUNTER — Other Ambulatory Visit: Payer: Self-pay | Admitting: Internal Medicine

## 2016-02-27 ENCOUNTER — Telehealth: Payer: Self-pay | Admitting: Family Medicine

## 2016-02-27 NOTE — Telephone Encounter (Signed)
Patient request a refill

## 2016-02-27 NOTE — Telephone Encounter (Signed)
Pt calling inquiring about his sleep study results

## 2016-02-28 MED ORDER — ALBUTEROL SULFATE HFA 108 (90 BASE) MCG/ACT IN AERS: 2.0000 | INHALATION_SPRAY | RESPIRATORY_TRACT | 0 refills | Status: DC | PRN

## 2016-02-28 NOTE — Telephone Encounter (Signed)
Writer called patient and discussed sleep study per Dr.Amao  Patient to be scheduled for an appt to discuss future plans.

## 2016-02-28 NOTE — Telephone Encounter (Signed)
Sleep study is positive for obstructive sleep apnea. Needs office visit to discuss further management.

## 2016-02-29 ENCOUNTER — Telehealth: Payer: Self-pay

## 2016-02-29 ENCOUNTER — Ambulatory Visit: Payer: Self-pay | Attending: Family Medicine | Admitting: Family Medicine

## 2016-02-29 ENCOUNTER — Encounter: Payer: Self-pay | Admitting: Family Medicine

## 2016-02-29 VITALS — BP 141/90 | HR 70 | Temp 98.5°F | Ht 72.0 in | Wt 279.8 lb

## 2016-02-29 DIAGNOSIS — G4733 Obstructive sleep apnea (adult) (pediatric): Secondary | ICD-10-CM | POA: Insufficient documentation

## 2016-02-29 DIAGNOSIS — I10 Essential (primary) hypertension: Secondary | ICD-10-CM | POA: Insufficient documentation

## 2016-02-29 DIAGNOSIS — J45909 Unspecified asthma, uncomplicated: Secondary | ICD-10-CM | POA: Insufficient documentation

## 2016-02-29 DIAGNOSIS — Z7982 Long term (current) use of aspirin: Secondary | ICD-10-CM | POA: Insufficient documentation

## 2016-02-29 DIAGNOSIS — G4730 Sleep apnea, unspecified: Secondary | ICD-10-CM | POA: Insufficient documentation

## 2016-02-29 NOTE — Progress Notes (Signed)
Subjective:  Patient ID: Justin DowdyMichael Rennard Traeger Jr., male    DOB: January 21, 1976  Age: 40 y.o. MRN: [id number]  CC: sleep study (results and discussion) and Hypertension   HPI Justin DowdyMichael Rennard Rahming Jr. presents for A discussion on his sleep study reports which reveals severe obstructive sleep apnea with recommendations for BiPAP at 25/21 cm of water. Medical history significant for hypertension.  The patient endorses not being well rested during the day regardless of how many hours of sleep he has.  Past Medical History:  Diagnosis Date  . Asthma   . Hypertension   . Irregular heartbeat     History reviewed. No pertinent surgical history.  Allergies  Allergen Reactions  . Lisinopril Hives      Outpatient Medications Prior to Visit  Medication Sig Dispense Refill  . albuterol (PROVENTIL HFA;VENTOLIN HFA) 108 (90 Base) MCG/ACT inhaler Inhale 2 puffs into the lungs every 4 (four) hours as needed for wheezing or shortness of breath. 1 Inhaler 0  . amLODipine (NORVASC) 10 MG tablet Take 1 tablet (10 mg total) by mouth daily. 30 tablet 2  . aspirin EC 81 MG tablet Take 81 mg by mouth daily.    . cromolyn (NASALCROM) 5.2 MG/ACT nasal spray Place 1 spray into both nostrils daily.    Marland Kitchen. ibuprofen (ADVIL,MOTRIN) 600 MG tablet Take 1 tablet (600 mg total) by mouth every 8 (eight) hours as needed. 30 tablet 1  . losartan (COZAAR) 100 MG tablet Take 1 tablet (100 mg total) by mouth daily. 90 tablet 3   No facility-administered medications prior to visit.     ROS Review of Systems  Constitutional: Negative for activity change and appetite change.  HENT: Negative for sinus pressure and sore throat.   Respiratory: Negative for chest tightness, shortness of breath and wheezing.   Cardiovascular: Negative for chest pain and palpitations.  Gastrointestinal: Negative for abdominal distention, abdominal pain and constipation.  Genitourinary: Negative.   Musculoskeletal: Negative.     Psychiatric/Behavioral: Positive for sleep disturbance. Negative for behavioral problems and dysphoric mood.    Objective:  BP (!) 141/90 (BP Location: Right Arm, Patient Position: Sitting, Cuff Size: Large)   Pulse 70   Temp 98.5 F (36.9 C) (Oral)   Ht 6' (1.829 m)   Wt 279 lb 12.8 oz (126.9 kg)   SpO2 96%   BMI 37.95 kg/m   BP/Weight 02/29/2016 01/30/2016 11/30/2015  Systolic BP 141 - 154  Diastolic BP 90 - 99  Wt. (Lbs) 279.8 250 281  BMI 37.95 33.91 38.11      Physical Exam  Constitutional: He is oriented to person, place, and time. He appears well-developed and well-nourished.  Obese  Cardiovascular: Normal rate, normal heart sounds and intact distal pulses.   No murmur heard. Pulmonary/Chest: Effort normal and breath sounds normal. He has no wheezes. He has no rales. He exhibits no tenderness.  Abdominal: Soft. Bowel sounds are normal. He exhibits no distension and no mass. There is no tenderness.  Musculoskeletal: Normal range of motion.  Neurological: He is alert and oriented to person, place, and time.     Assessment & Plan:   1. Obstructive sleep apnea syndrome Patient has no medical coverage and this we will be he is limitation to obtaining a BiPAP Machine Case manager, to discuss available options with the patient including self pay with a DME company  2. Essential hypertension Slight diastolic elevation We will reassess BP again at next visit   No orders of the  defined types were placed in this encounter.   Follow-up: Return in about 1 month (around 03/31/2016) for Follow-up on hypertension.   Jaclyn ShaggyEnobong Amao MD

## 2016-02-29 NOTE — Telephone Encounter (Signed)
This Case Manager met with patient in clinic during office visit with Dr. Jarold Song. Dr. Jarold Song indicated patient needing Bipap machine. Patient uninsured. This Case Manager informed patient that unfortunately we do not have any resources to assist with obtain Bipap for uninsured patients, and he will likely have to self pay for machine. Patient verbalized understanding. Provided list of DME companies.  Call placed to [place] [facility] to determine if they have any resources for uninsured patients who need Bipap therapy, and if not to determine out of pocket cost for machine and supplies. Call was transferred to [facility] department; voicemail left for [facility]. Awaiting return call.

## 2016-02-29 NOTE — Patient Instructions (Signed)

## 2016-03-01 ENCOUNTER — Telehealth: Payer: Self-pay

## 2016-03-01 NOTE — Telephone Encounter (Signed)
This Case Manager placed an additional call to [facility] to follow-up and determine if they have any resources for uninsured patients who need Bipap therapy, and if not to determine out of pocket cost for machine and supplies. Pilar Jarvisodney Spalding in Respiratory Department unavailable. Message left requesting return call. Also, spoke with Delice Bisonara with [facility] to determine if she knows of any resources to assist uninsured patients with obtaining Bipap machine, and she indicated she was not aware of any resources. Awaiting return call from [facility].

## 2016-03-07 ENCOUNTER — Telehealth: Payer: Self-pay

## 2016-03-07 NOTE — Telephone Encounter (Signed)
Message received from [place] with [employer] stating that they do have some Bi level machines available right now but they go quickly. She left her contact information.   Call placed to Megan at the [employer] # [phone] and a message was left requesting a call back to # [phone].

## 2016-03-07 NOTE — Telephone Encounter (Signed)
Call received from [facility] with [employer]. She stated that they just received some "bi level" machines and they go fast. She noted that the cost is $100 , the same as for the CPAP. She said that the application is completed the same as for the CPAP but " bi level" pressure settings need to be noted.   Application forwarded to Dr Venetia NightAmao for signature.

## 2016-03-07 NOTE — Telephone Encounter (Signed)
Call placed to [facility] to inquire about the cost of a BiPAP machine for a patient without insurance. As per Thereasa Distanceodney, the unit costs approximately $1300-1500.  Rental is not an option. He also noted that he thinks the needed supplies are included with the purchase.   [facility] placed to [facility] to inquire about possible resources for a BiPAP machine for a patient without insurance. Spoke to DevolKaya who noted that there were not any resources that she was aware of.  Call placed to the [employer] - CPAP Assistance Program # [phone]  To inquire if any resources are available for patients who need a BiPAP and do not have insurance. Voicemail message left requesting a call back to # [phone] or [phone].

## 2016-03-08 ENCOUNTER — Other Ambulatory Visit: Payer: Self-pay | Admitting: Cardiology

## 2016-03-08 ENCOUNTER — Other Ambulatory Visit: Payer: Self-pay | Admitting: Internal Medicine

## 2016-03-08 MED FILL — LOSARTAN POTASSIUM 100 MG T: 100 | 30 days supply | Qty: 30 | Fill #0

## 2016-03-08 NOTE — Telephone Encounter (Signed)
amLODipine (NORVASC) 10 MG tablet  Medication  Date: 07/19/2015 [facility] Ordering: Adella NissenStacey A Karl, [facility] Authorizing: Quentin Angstlugbemiga E Jegede, MD  Order Providers   Prescribing Provider Encounter Provider  Quentin Angstlugbemiga E Jegede, MD Quentin Angstlugbemiga E Jegede, MD  Medication Detail    Disp Refills Start End   amLODipine (NORVASC) 10 MG tablet 30 tablet 2 07/19/2015    Sig - Route: Take 1 tablet (10 mg total) by mouth daily. - Oral   E-Prescribing Status: Receipt confirmed by pharmacy (07/19/2015 11:13 AM EDT)   Pharmacy   [facility] [place], [place] - [address]

## 2016-03-08 NOTE — Progress Notes (Signed)
[employer] Assistance Program form completed by Dr. Venetia NightAmao noting Bipap settings. Form faxed to [employer]-Fax #[phone].

## 2016-03-09 ENCOUNTER — Telehealth: Payer: Self-pay

## 2016-03-09 NOTE — Telephone Encounter (Signed)
Call placed to [employer] at #[phone] to determine if they received application; however, unable to reach. Voicemail left requesting return call.

## 2016-03-13 ENCOUNTER — Other Ambulatory Visit: Payer: Self-pay | Admitting: Internal Medicine

## 2016-03-13 ENCOUNTER — Telehealth: Payer: Self-pay

## 2016-03-13 NOTE — Telephone Encounter (Signed)
Call placed to the [employer] #[phone] to confirm that the application was received and there is still a BiPAP machine available. Voicemail message left requesting a call back to # [phone].

## 2016-03-13 NOTE — Telephone Encounter (Signed)
[facility] with the [employer] confirming that the referral for the BiPAP was received and they have BiPAP units available.

## 2016-03-14 ENCOUNTER — Telehealth: Payer: Self-pay

## 2016-03-14 MED FILL — AMLODIPINE BESYLATE 10 MG T: 10 | 30 days supply | Qty: 30 | Fill #0

## 2016-03-14 NOTE — Telephone Encounter (Signed)
This Case Manager placed call to Delice Bisonara with [facility] to determine if they are able to provide teaching on how to use a bipap machine. She indicated a Respiratory therapist able to provide teaching for patient. If machine arrives to clinic in time, they are able to provide teaching on 03/22/16 or 03/23/16 at 1100. Call placed to patient to inform him a Bipap machine is available through [employer] and to determine if patient able to afford [employer] donation fee for machine. Call placed to #[phone]; however, unable to reach patient. HIPPA compliant voicemail left requesting return call.

## 2016-03-15 ENCOUNTER — Telehealth: Payer: Self-pay

## 2016-03-15 NOTE — Telephone Encounter (Signed)
This Case Manager placed an additional call to patient to inform him a Bipap machine is available through [employer] and to determine if patient able to afford [employer] donation fee for machine. Call placed to #[phone]; however, unable to reach patient. HIPPA compliant voicemail left requesting return call.

## 2016-03-15 NOTE — Telephone Encounter (Signed)
This Case Manager received voicemail from patient requesting return call. Return call made to patient. Informed patient that a bipap machine is available through [employer]. Application has been sent for patient. Patient appreciative and indicated he is able to pay [employer] program fee. Patient informed that once machine arrives at clinic, he will need to come in for Bipap teaching and mask fitting with [facility]. Patient verbalized understanding. [facility] and Bipap tentatively scheduled for 03/23/16 at 1100. No additional needs identified.

## 2016-03-22 ENCOUNTER — Telehealth: Payer: Self-pay

## 2016-03-22 NOTE — Telephone Encounter (Signed)
This Case Manager placed call to patient to determine if he had followed up with [employer] regarding his bipap. Patient indicated he had, and he paid program fee on 03/16/16. He indicated tracking shows the bipap machine should be delivered to clinic sometime on 03/23/16. Patient was scheduled to have bipap teaching on 03/23/16 at 1100; however, discussed with patient that teaching may need to be rescheduled for a later date since his bipap machine may not be delivered in time for teaching. Patient agreeable. Spoke with Delice Bisonara with [facility] and patient's bipap teaching rescheduled for 03/28/16 at 1000. Patient updated and agreeable to bipap teaching on 03/28/16 at 1000. No additional needs identified at this time.

## 2016-03-26 ENCOUNTER — Telehealth: Payer: Self-pay

## 2016-03-26 NOTE — Telephone Encounter (Signed)
Attempted to contact the patient to inform him that [facility] and to remind him of the teaching appointment on 1/03/18/16 @ 1000. Call placed to # [phone] (M) and a HIPAA compliant voicemail message was left requesting a call back to # [phone] or [phone]

## 2016-03-26 NOTE — Telephone Encounter (Signed)
Call received from the patient. Informed him that his BiPAP machine arrived and confirmed the BiPAP teaching for 03/28/16 @ 1000 @ [facility].

## 2016-03-28 ENCOUNTER — Telehealth: Payer: Self-pay

## 2016-03-28 NOTE — Telephone Encounter (Signed)
Call placed to the patient this morning to inform [facility] and his BiPAP teaching will need to be rescheduled.    [facility] and informed her that the BiPAP teaching will need to be rescheduled.  The  appointment was rescheduled for 03/30/16 @ 1100.  Call placed to the patient and informed him of the new time for teaching.  He was very appreciative of the call and ability to reschedule so quickly as he is aware of the impacts of the weather

## 2016-03-30 ENCOUNTER — Telehealth: Payer: Self-pay

## 2016-03-30 NOTE — Telephone Encounter (Signed)
This Case Manager placed call to Delice Bisonara with [facility] who confirmed a Respiratory Therapist is available today at 1100 to do Bipap teaching. Call placed to patient who confirmed [facility] would be [facility] at 1100 for Bipap teaching.

## 2016-03-30 NOTE — Progress Notes (Signed)
Patient presented to clinic and received mask fitting and teaching from [facility] on how to properly use bipap machine. Patient given bipap machine that was sent from [employer]. No additional needs identified.

## 2016-06-04 IMAGING — CR DG CHEST 2V
1 series · 3 of 3 positions shown · non-contrast
Comparison: 07/21/2012

CLINICAL DATA: Pain under the left scapula.

EXAM:
CHEST  2 VIEW

[Series 1: pa · 0.17mm/px · 3 of 3 slices shown]
[im 1/3]
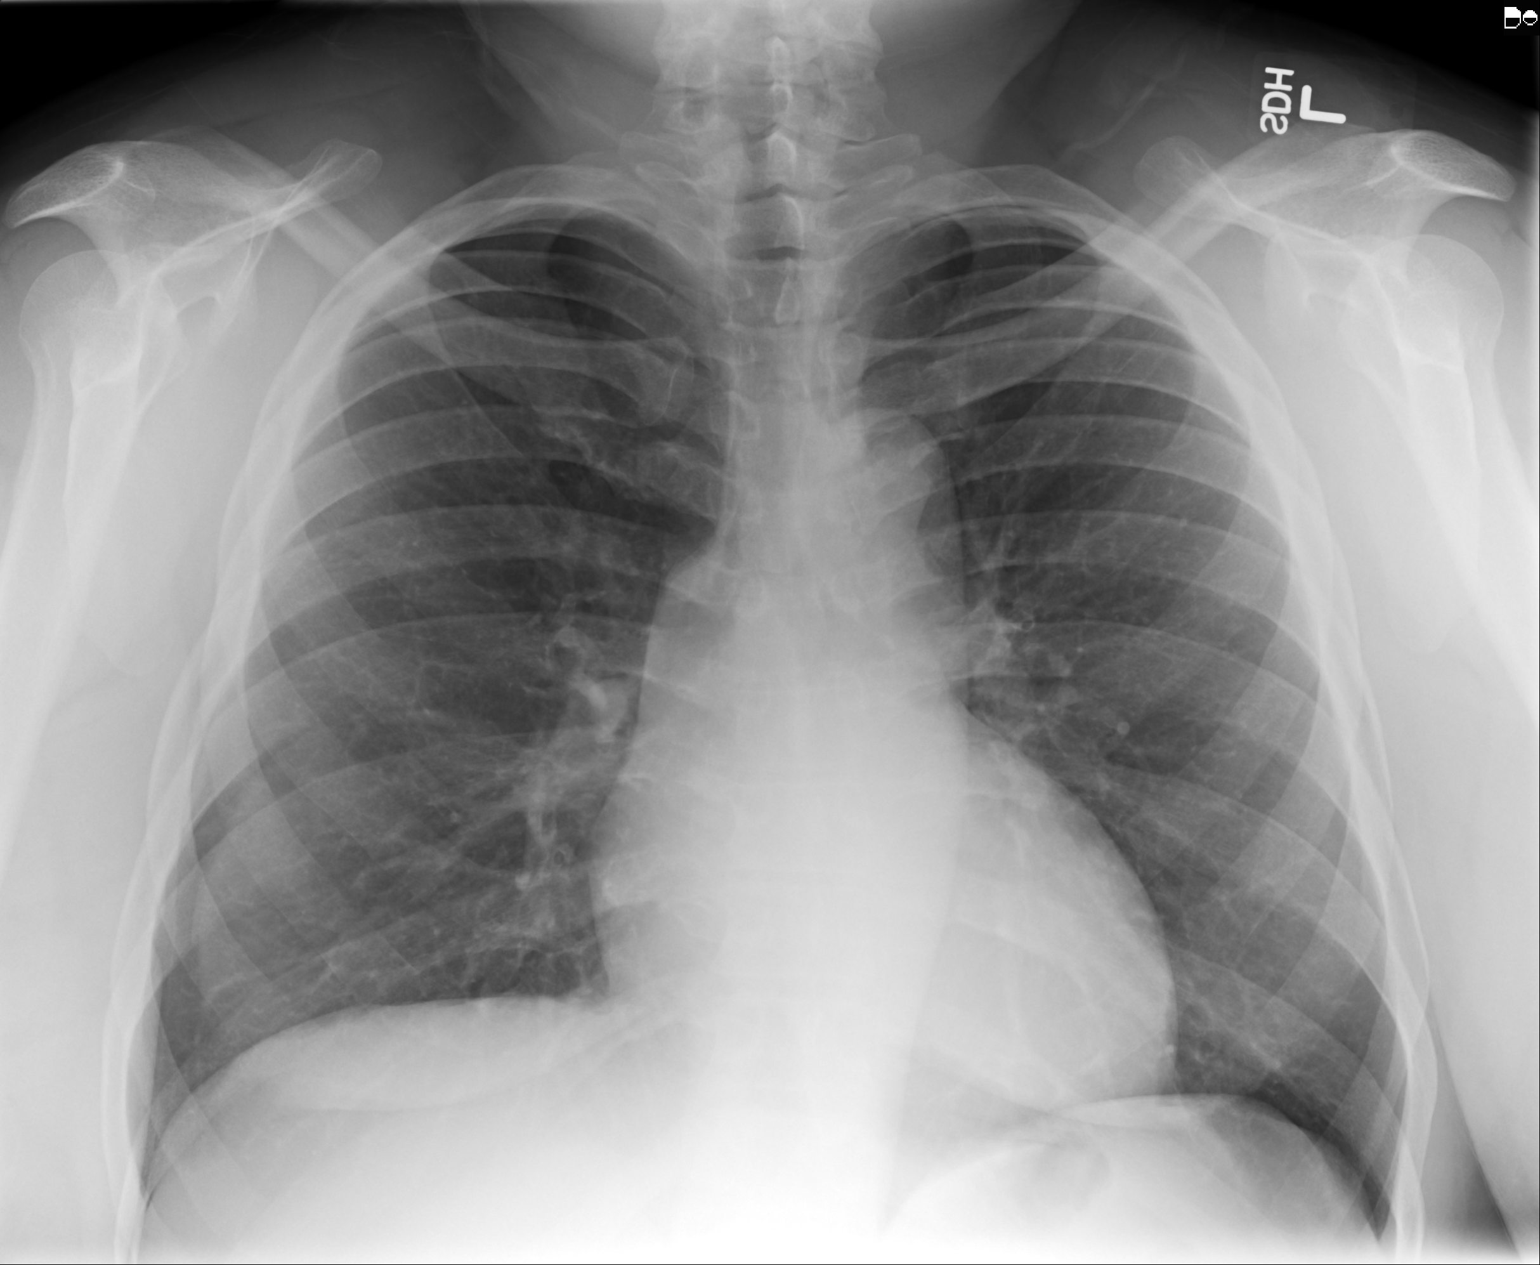
[im 2/3]
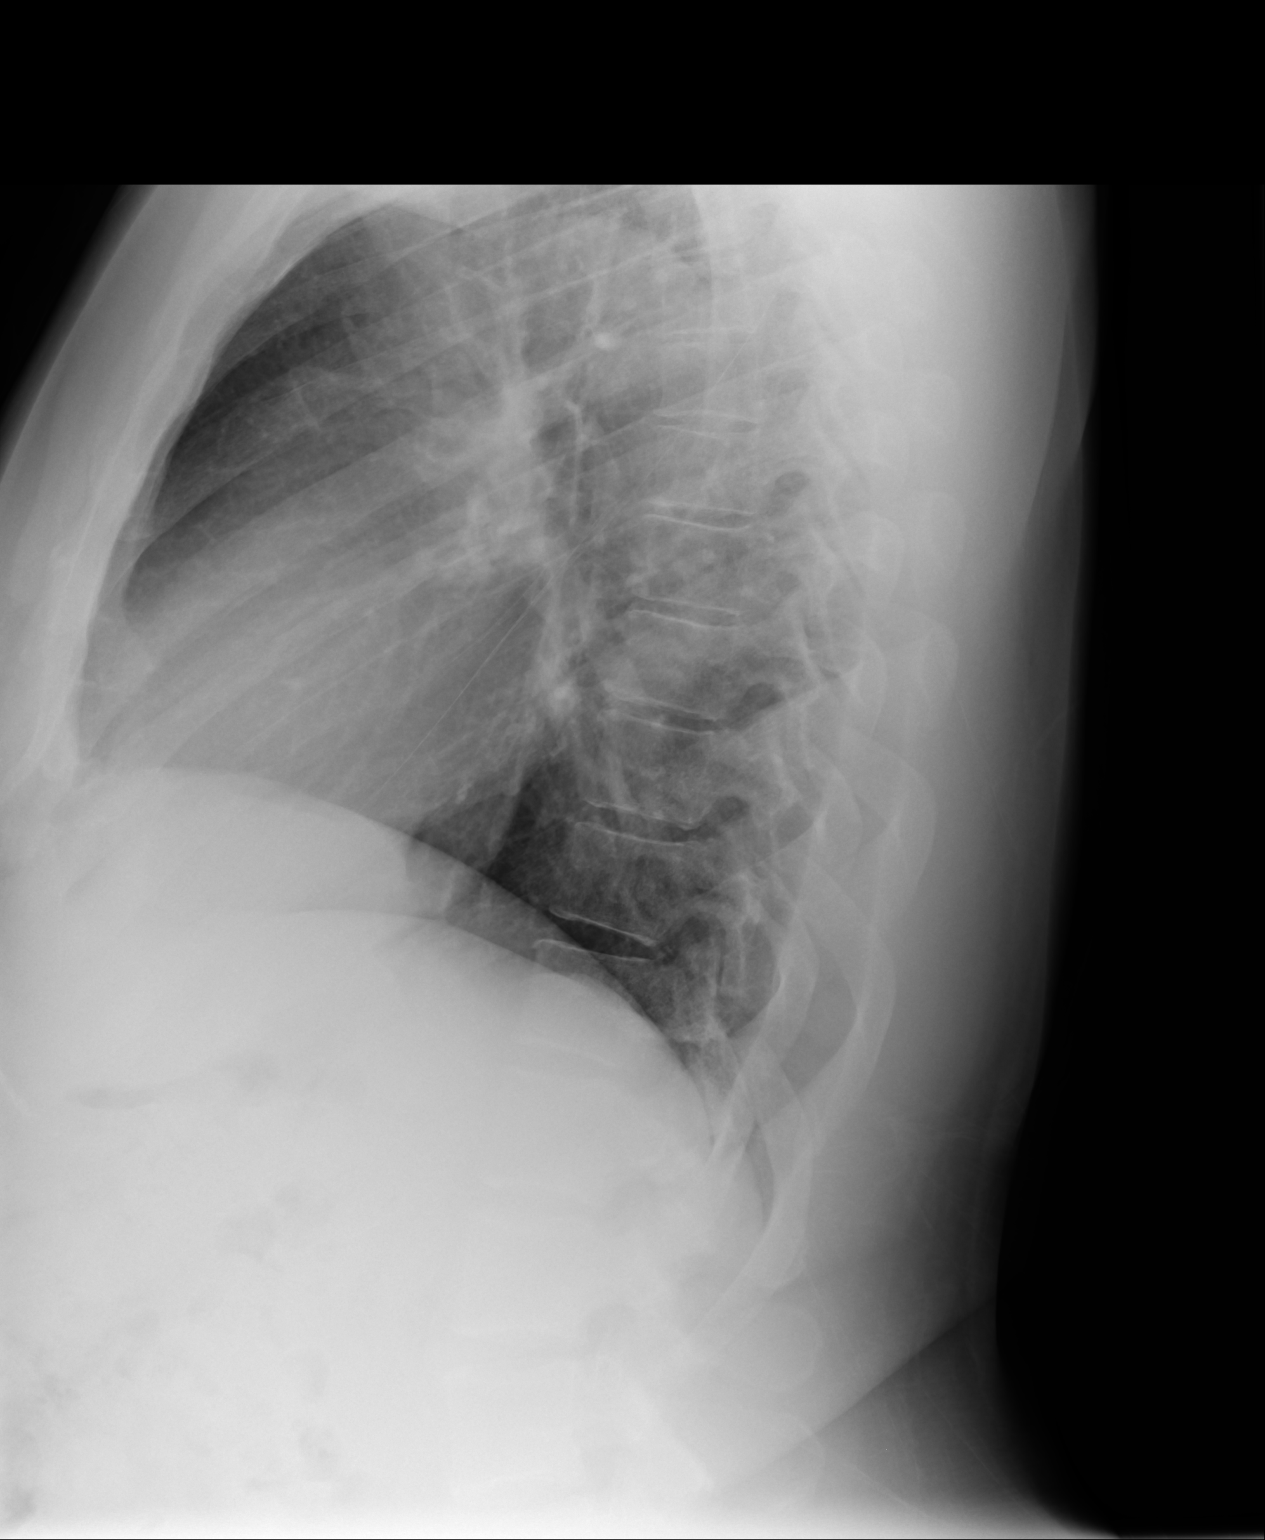
[im 3/3]
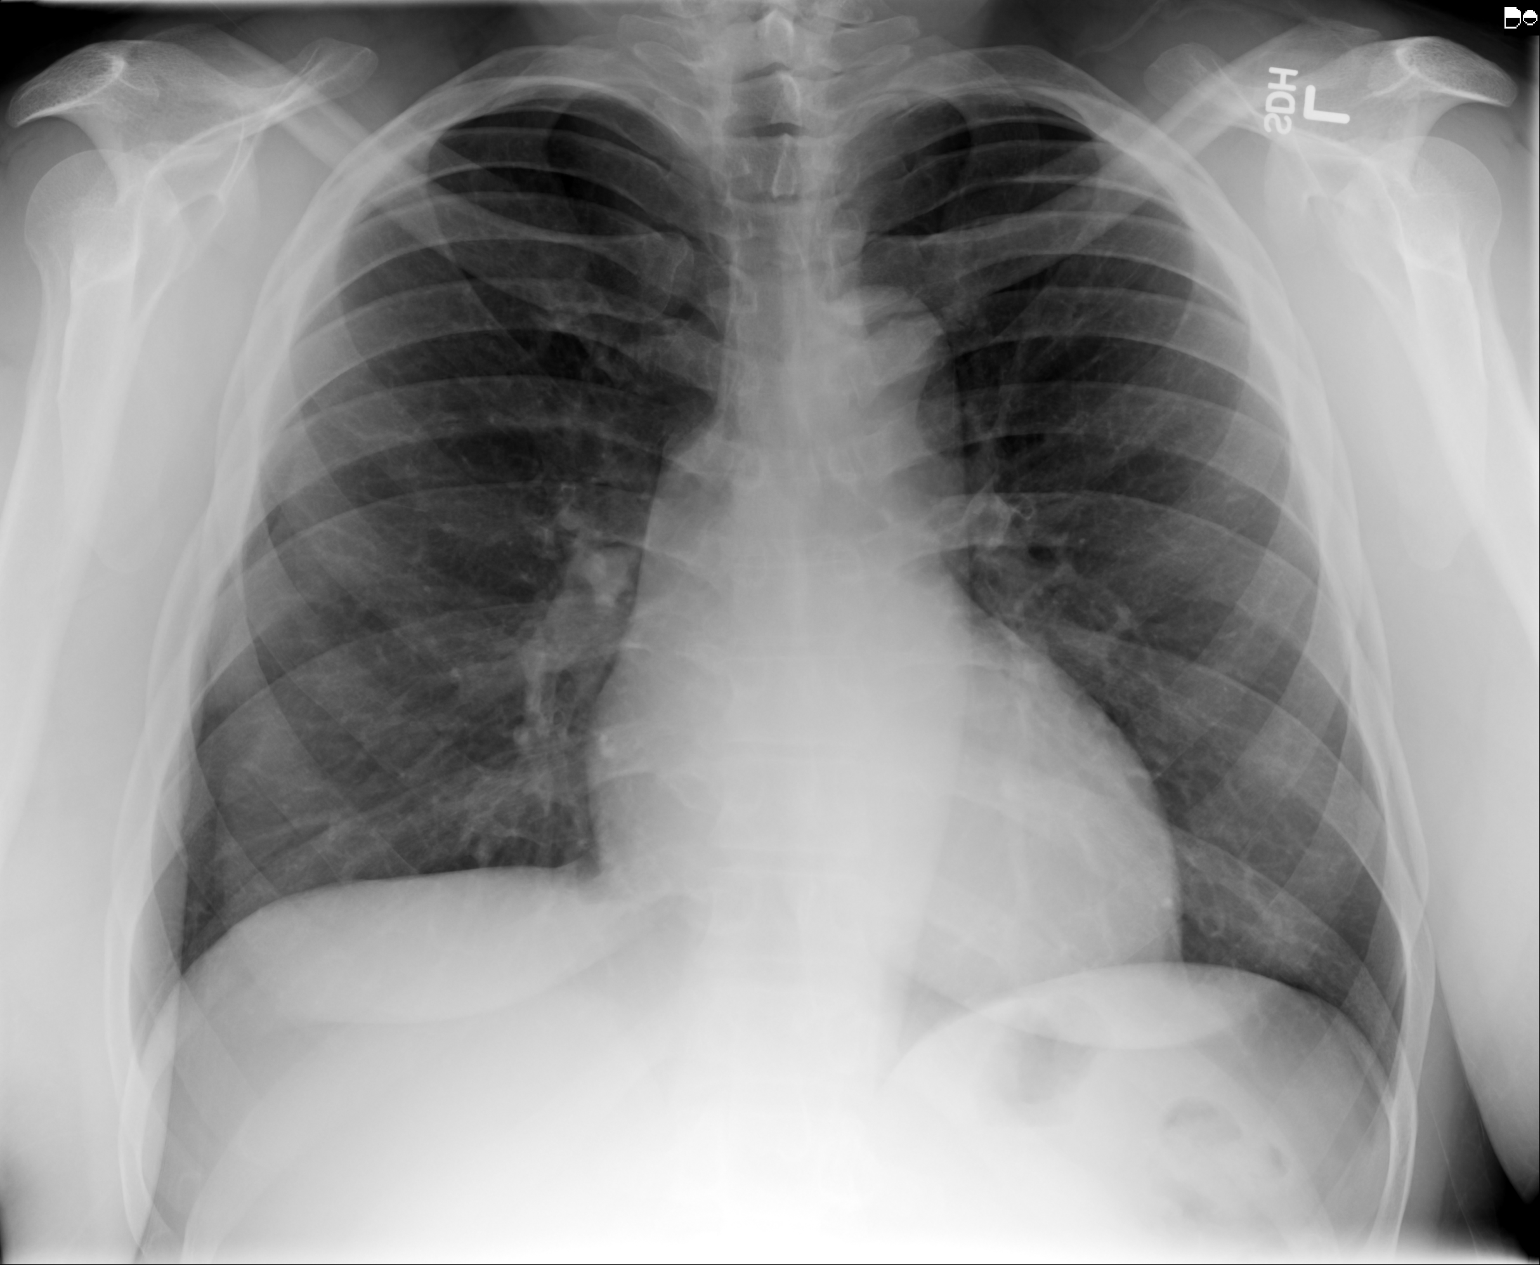

[3 of 3 positions shown; findings below may reference images not displayed]

FINDINGS: Two views of the chest demonstrate clear lungs. Heart and
mediastinum are within normal limits. The trachea is midline. No
acute bone abnormality.
IMPRESSION: No acute cardiopulmonary disease.

## 2016-06-21 MED FILL — ?AMLODIPINE BESYLATE 10 MG: 10 | 30 days supply | Qty: 30 | Fill #1

## 2016-06-21 MED FILL — LOSARTAN POTASSIUM 100 MG T: 100 | 30 days supply | Qty: 30 | Fill #1

## 2016-06-28 MED FILL — !VENTOLIN HFA INHALER: 108 (90 BAS | 16 days supply | Qty: 18 | Fill #0

## 2016-07-06 ENCOUNTER — Ambulatory Visit: Payer: Self-pay | Attending: Family Medicine | Admitting: Family Medicine

## 2016-07-06 ENCOUNTER — Encounter: Payer: Self-pay | Admitting: Family Medicine

## 2016-07-06 ENCOUNTER — Other Ambulatory Visit: Payer: Self-pay

## 2016-07-06 VITALS — BP 133/81 | HR 69 | Temp 98.5°F | Ht 72.0 in | Wt 279.0 lb

## 2016-07-06 DIAGNOSIS — R1031 Right lower quadrant pain: Secondary | ICD-10-CM

## 2016-07-06 DIAGNOSIS — M1A0790 Idiopathic chronic gout, unspecified ankle and foot, without tophus (tophi): Secondary | ICD-10-CM

## 2016-07-06 DIAGNOSIS — R0789 Other chest pain: Secondary | ICD-10-CM

## 2016-07-06 DIAGNOSIS — E119 Type 2 diabetes mellitus without complications: Secondary | ICD-10-CM

## 2016-07-06 DIAGNOSIS — J3089 Other allergic rhinitis: Secondary | ICD-10-CM

## 2016-07-06 DIAGNOSIS — J302 Other seasonal allergic rhinitis: Secondary | ICD-10-CM | POA: Insufficient documentation

## 2016-07-06 DIAGNOSIS — J45909 Unspecified asthma, uncomplicated: Secondary | ICD-10-CM | POA: Insufficient documentation

## 2016-07-06 DIAGNOSIS — I10 Essential (primary) hypertension: Secondary | ICD-10-CM

## 2016-07-06 DIAGNOSIS — J4520 Mild intermittent asthma, uncomplicated: Secondary | ICD-10-CM

## 2016-07-06 DIAGNOSIS — R079 Chest pain, unspecified: Secondary | ICD-10-CM | POA: Insufficient documentation

## 2016-07-06 LAB — POCT GLYCOSYLATED HEMOGLOBIN (HGB A1C): Hemoglobin A1C: 6.1

## 2016-07-06 LAB — GLUCOSE, POCT (MANUAL RESULT ENTRY): POC Glucose: 121 mg/dl — AB (ref 70–99)

## 2016-07-06 MED ORDER — CETIRIZINE HCL 10 MG PO TABS: 10.0000 mg | ORAL_TABLET | Freq: Every day | ORAL | 3 refills | Status: AC

## 2016-07-06 MED ORDER — FLUTICASONE PROPIONATE 50 MCG/ACT NA SUSP: 2.0000 | Freq: Every day | NASAL | 6 refills | Status: DC

## 2016-07-06 MED ORDER — AMLODIPINE BESYLATE 10 MG PO TABS: 10.0000 mg | ORAL_TABLET | Freq: Every day | ORAL | 6 refills | Status: DC

## 2016-07-06 MED ORDER — LOSARTAN POTASSIUM 100 MG PO TABS: 100.0000 mg | ORAL_TABLET | Freq: Every day | ORAL | 6 refills | Status: DC

## 2016-07-06 MED ORDER — ALBUTEROL SULFATE HFA 108 (90 BASE) MCG/ACT IN AERS: 2.0000 | INHALATION_SPRAY | RESPIRATORY_TRACT | 3 refills | Status: AC | PRN

## 2016-07-06 MED FILL — FLUTICASONE PROP 50 MCG SPR: 50 | 30 days supply | Qty: 16 | Fill #0

## 2016-07-06 MED FILL — AMLODIPINE BESYLATE 10 MG T: 10 | 30 days supply | Qty: 30 | Fill #0

## 2016-07-06 MED FILL — ?CETIRIZINE HCL 10 MG TABLE: 10 | 30 days supply | Qty: 30 | Fill #0

## 2016-07-06 MED FILL — LOSARTAN POTASSIUM 100 MG T: 100 | 30 days supply | Qty: 30 | Fill #0

## 2016-07-06 NOTE — Progress Notes (Signed)
Subjective:  Patient ID: Justin Liu., male    DOB: 1975-11-20  Age: 41 y.o. MRN: [id number]  CC: Hypertension; Allergies (would like meds); Diabetes; chest cramp; and Abdominal Pain (right lower abdomen)   HPI Justin Liu.  is a 41 year old male with a history of type 2 diabetes mellitus (A1c 6.1), hypertension, asthma who presents today for a follow-up visit.  Compliant with his antihypertensive, uses his albuterol MDI sparingly with no asthma exacerbations. Diabetes is diet controlled and he exercises regularly. Denies gout flares.  Complains of allergies-tearing from his eyes, runny nose, excessive sneezing and postnasal drip. He has also had precordial chest pain at rest on 2 different occasions with no shortness of breath, numbness or diaphoresis. He had a nuclear stress test 07/2015 which revealed EF of 43%, low risk study.  Also complains of one occasion where he had right lower quadrant abdominal pain; moves bowels daily to every other day and denies nausea or vomiting.  Past Medical History:  Diagnosis Date  . Asthma   . Hypertension   . Irregular heartbeat     History reviewed. No pertinent surgical history.  Allergies  Allergen Reactions  . Lisinopril Hives    Outpatient Medications Prior to Visit  Medication Sig Dispense Refill  . aspirin EC 81 MG tablet Take 81 mg by mouth daily.    Marland Kitchen ibuprofen (ADVIL,MOTRIN) 600 MG tablet Take 1 tablet (600 mg total) by mouth every 8 (eight) hours as needed. 30 tablet 1  . albuterol (PROVENTIL HFA;VENTOLIN HFA) 108 (90 Base) MCG/ACT inhaler Inhale 2 puffs into the lungs every 4 (four) hours as needed for wheezing or shortness of breath. 1 Inhaler 0  . amLODipine (NORVASC) 10 MG tablet Take 1 tablet (10 mg total) by mouth daily. 30 tablet 3  . losartan (COZAAR) 100 MG tablet TAKE 1 TABLET BY MOUTH DAILY 90 tablet 0  . cromolyn (NASALCROM) 5.2 MG/ACT nasal spray Place 1 spray into both nostrils daily.      No facility-administered medications prior to visit.     ROS Review of Systems  Constitutional: Negative for activity change and appetite change.  HENT: Negative for sinus pressure and sore throat.   Eyes: Negative for visual disturbance.  Respiratory: Negative for cough, chest tightness and shortness of breath.   Cardiovascular: Negative for chest pain and leg swelling.  Gastrointestinal: Negative for abdominal distention, abdominal pain, constipation and diarrhea.  Endocrine: Negative.   Genitourinary: Negative for dysuria.  Musculoskeletal: Negative for joint swelling and myalgias.  Skin: Negative for rash.  Allergic/Immunologic: Negative.   Neurological: Negative for weakness, light-headedness and numbness.  Psychiatric/Behavioral: Negative for dysphoric mood and suicidal ideas.    Objective:  BP 133/81 (BP Location: Right Arm, Patient Position: Sitting, Cuff Size: Large)   Pulse 69   Temp 98.5 F (36.9 C) (Oral)   Ht 6' (1.829 m)   Wt 279 lb (126.6 kg)   SpO2 96%   BMI 37.84 kg/m   BP/Weight 07/06/2016 02/29/2016 58/85/0277  Systolic BP 412 878 -  Diastolic BP 81 90 -  Wt. (Lbs) 279 279.8 250  BMI 37.84 37.95 33.91      Physical Exam  Constitutional: He is oriented to person, place, and time. He appears well-developed and well-nourished.  HENT:  Right Ear: External ear normal.  Left Ear: External ear normal.  Mouth/Throat: Oropharynx is clear and moist.  Neck: No JVD present.  Cardiovascular: Normal rate, normal heart sounds and intact distal pulses.  No murmur heard. Pulmonary/Chest: Effort normal and breath sounds normal. He has no wheezes. He has no rales. He exhibits no tenderness.  Abdominal: Soft. Bowel sounds are normal. He exhibits no distension and no mass. There is no tenderness.  Musculoskeletal: Normal range of motion.  Neurological: He is alert and oriented to person, place, and time.  Skin: Skin is warm and dry.  Psychiatric: He has a  normal mood and affect.     Lab Results  Component Value Date   HGBA1C 6.1 07/06/2016     CMP Latest Ref Rng & Units 02/10/2015 08/06/2014 05/31/2014  Glucose 65 - 99 mg/dL 107(H) 124(H) 114(H)  BUN 7 - 25 mg/dL 17 26(H) 22  Creatinine 0.60 - 1.35 mg/dL 1.24 1.34 1.12  Sodium 135 - 146 mmol/L 138 139 140  Potassium 3.5 - 5.3 mmol/L 3.4(L) 2.9(L) 3.3(L)  Chloride 98 - 110 mmol/L 97(L) 94(L) 98  CO2 20 - 31 mmol/L 31 29 32  Calcium 8.6 - 10.3 mg/dL 9.6 9.2 10.0  Total Protein 6.0 - 8.3 g/dL - 8.1 7.6  Total Bilirubin 0.2 - 1.2 mg/dL - 0.4 0.2  Alkaline Phos 39 - 117 U/L - 55 52  AST 0 - 37 U/L - 23 14  ALT 0 - 53 U/L - 25 15    Assessment & Plan:   1. Type 2 diabetes mellitus without complication, without long-term current use of insulin (HCC) Diet controlled with A1c of 6.1 Lifestyle modification  - Glucose (CBG) - HgB A1c  2. Essential hypertension Controlled - amLODipine (NORVASC) 10 MG tablet; Take 1 tablet (10 mg total) by mouth daily.  Dispense: 30 tablet; Refill: 6 - losartan (COZAAR) 100 MG tablet; Take 1 tablet (100 mg total) by mouth daily.  Dispense: 30 tablet; Refill: 6 - CMP14+EGFR - Lipid panel  3. Seasonal allergic rhinitis due to other allergic trigger - fluticasone (FLONASE) 50 MCG/ACT nasal spray; Place 2 sprays into both nostrils daily.  Dispense: 16 g; Refill: 6 - cetirizine (ZYRTEC) 10 MG tablet; Take 1 tablet (10 mg total) by mouth daily.  Dispense: 30 tablet; Refill: 3  4. Mild intermittent asthma without complication Controlled No recent flares - albuterol (PROVENTIL HFA;VENTOLIN HFA) 108 (90 Base) MCG/ACT inhaler; Inhale 2 puffs into the lungs every 4 (four) hours as needed for wheezing or shortness of breath.  Dispense: 1 Inhaler; Refill: 3  5. Chronic gout of foot, unspecified cause, unspecified laterality No recent flare - Uric Acid  6. Other chest pain EKG revealed ST depressions in lateral leads which is unchanged from EKG  previously Myocardial perfusion scan from 07/2015 was a low risk study. Likely musculoskeletal If persisting referred to cardiology  7. Right lower quadrant abdominal pain Advised to work on preventing constipation Increase fiber   Meds ordered this encounter  Medications  . amLODipine (NORVASC) 10 MG tablet    Sig: Take 1 tablet (10 mg total) by mouth daily.    Dispense:  30 tablet    Refill:  6  . albuterol (PROVENTIL HFA;VENTOLIN HFA) 108 (90 Base) MCG/ACT inhaler    Sig: Inhale 2 puffs into the lungs every 4 (four) hours as needed for wheezing or shortness of breath.    Dispense:  1 Inhaler    Refill:  3  . losartan (COZAAR) 100 MG tablet    Sig: Take 1 tablet (100 mg total) by mouth daily.    Dispense:  30 tablet    Refill:  6  . fluticasone (FLONASE) 50  MCG/ACT nasal spray    Sig: Place 2 sprays into both nostrils daily.    Dispense:  16 g    Refill:  6  . cetirizine (ZYRTEC) 10 MG tablet    Sig: Take 1 tablet (10 mg total) by mouth daily.    Dispense:  30 tablet    Refill:  3    Follow-up: Return in about 6 months (around 01/05/2017) for Follow-up of chronic medical conditions.   Arnoldo Morale MD

## 2016-07-06 NOTE — Patient Instructions (Signed)

## 2016-07-07 LAB — CMP14+EGFR
ALT: 22 IU/L (ref 0–44)
AST: 17 IU/L (ref 0–40)
Albumin/Globulin Ratio: 1.2 (ref 1.2–2.2)
Albumin: 4.2 g/dL (ref 3.5–5.5)
Alkaline Phosphatase: 65 IU/L (ref 39–117)
BUN/Creatinine Ratio: 14 (ref 9–20)
BUN: 16 mg/dL (ref 6–24)
Bilirubin Total: <0.2 mg/dL (ref 0.0–1.2)
CO2: 24 mmol/L (ref 18–29)
CREATININE: 1.11 mg/dL (ref 0.76–1.27)
Calcium: 9.3 mg/dL (ref 8.7–10.2)
Chloride: 98 mmol/L (ref 96–106)
GFR calc Af Amer: 95 mL/min/{1.73_m2} (ref >59)
GFR calc non Af Amer: 82 mL/min/{1.73_m2} (ref >59)
GLUCOSE: 93 mg/dL (ref 65–99)
Globulin, Total: 3.4 g/dL (ref 1.5–4.5)
Potassium: 4 mmol/L (ref 3.5–5.2)
Sodium: 141 mmol/L (ref 134–144)
Total Protein: 7.6 g/dL (ref 6.0–8.5)

## 2016-07-07 LAB — LIPID PANEL
CHOLESTEROL TOTAL: 162 mg/dL (ref 100–199)
Chol/HDL Ratio: 6 ratio — ABNORMAL HIGH (ref 0.0–5.0)
HDL: 27 mg/dL — ABNORMAL LOW (ref >39)
LDL CALC: 105 mg/dL — AB (ref 0–99)
TRIGLYCERIDES: 152 mg/dL — AB (ref 0–149)
VLDL CHOLESTEROL CAL: 30 mg/dL (ref 5–40)

## 2016-07-07 LAB — URIC ACID: Uric Acid: 7.8 mg/dL (ref 3.7–8.6)

## 2016-08-13 ENCOUNTER — Telehealth: Payer: Self-pay | Admitting: Family Medicine

## 2016-08-13 NOTE — Telephone Encounter (Signed)
Patient called states he needed to schedule a stress test with Dr. Cato MulliganSwords per PCP, we are unaware of Dr. Cato MulliganSwords schedule, would patient need to follow up elsewhere? Please f/up

## 2016-08-13 NOTE — Telephone Encounter (Signed)
He already had a stress test in 07/2015 and this was discussed at his last office visit.

## 2016-08-13 NOTE — Telephone Encounter (Signed)
Patient states he was advised to complete a stress test. Are you familiar with this request?

## 2016-11-09 IMAGING — CR DG CHEST 2V
2 series · 2 of 2 positions shown · non-contrast
Comparison: 10/18/2013

CLINICAL DATA: Asthma, upper respiratory infection

EXAM:
CHEST  2 VIEW

[chest pa]
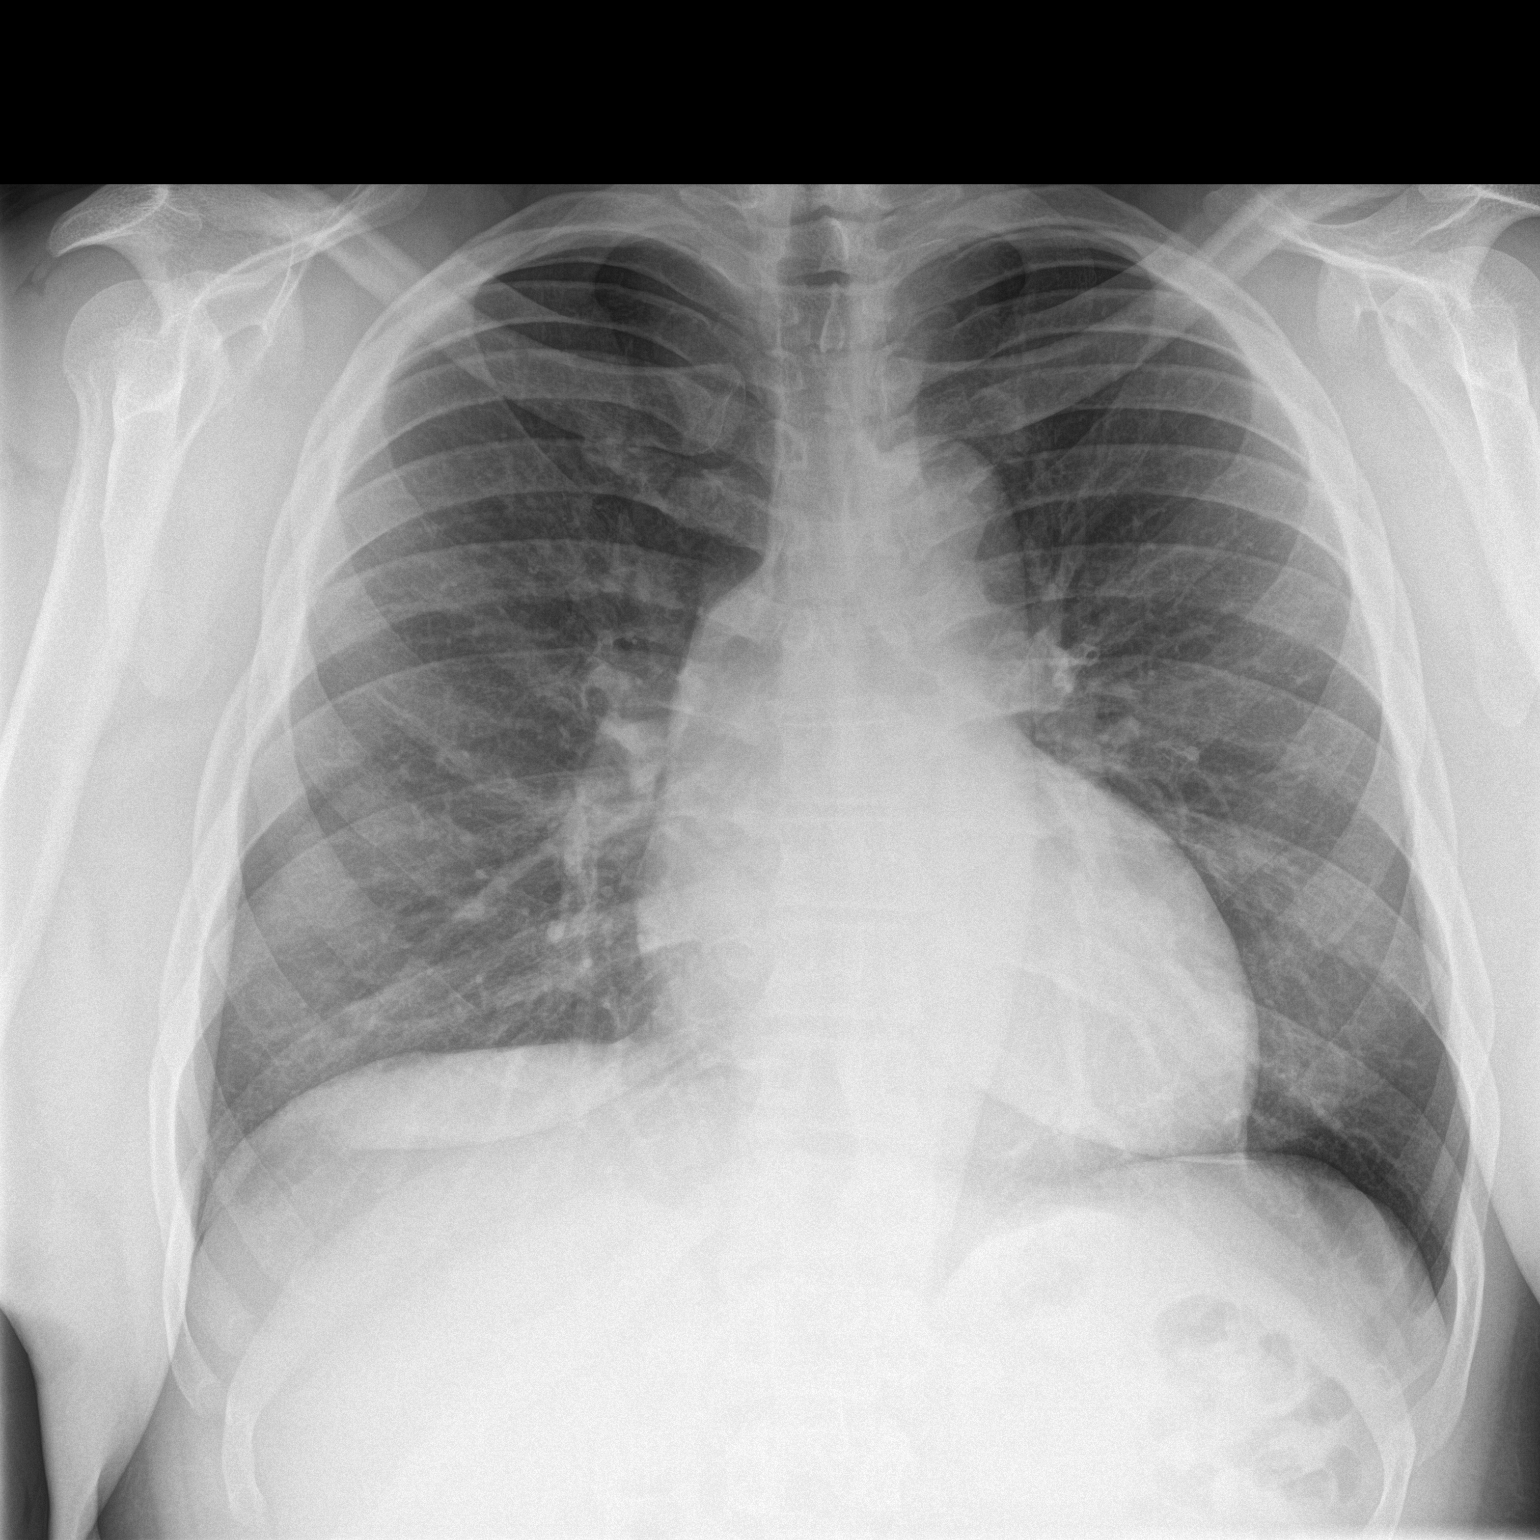

[chest lat]
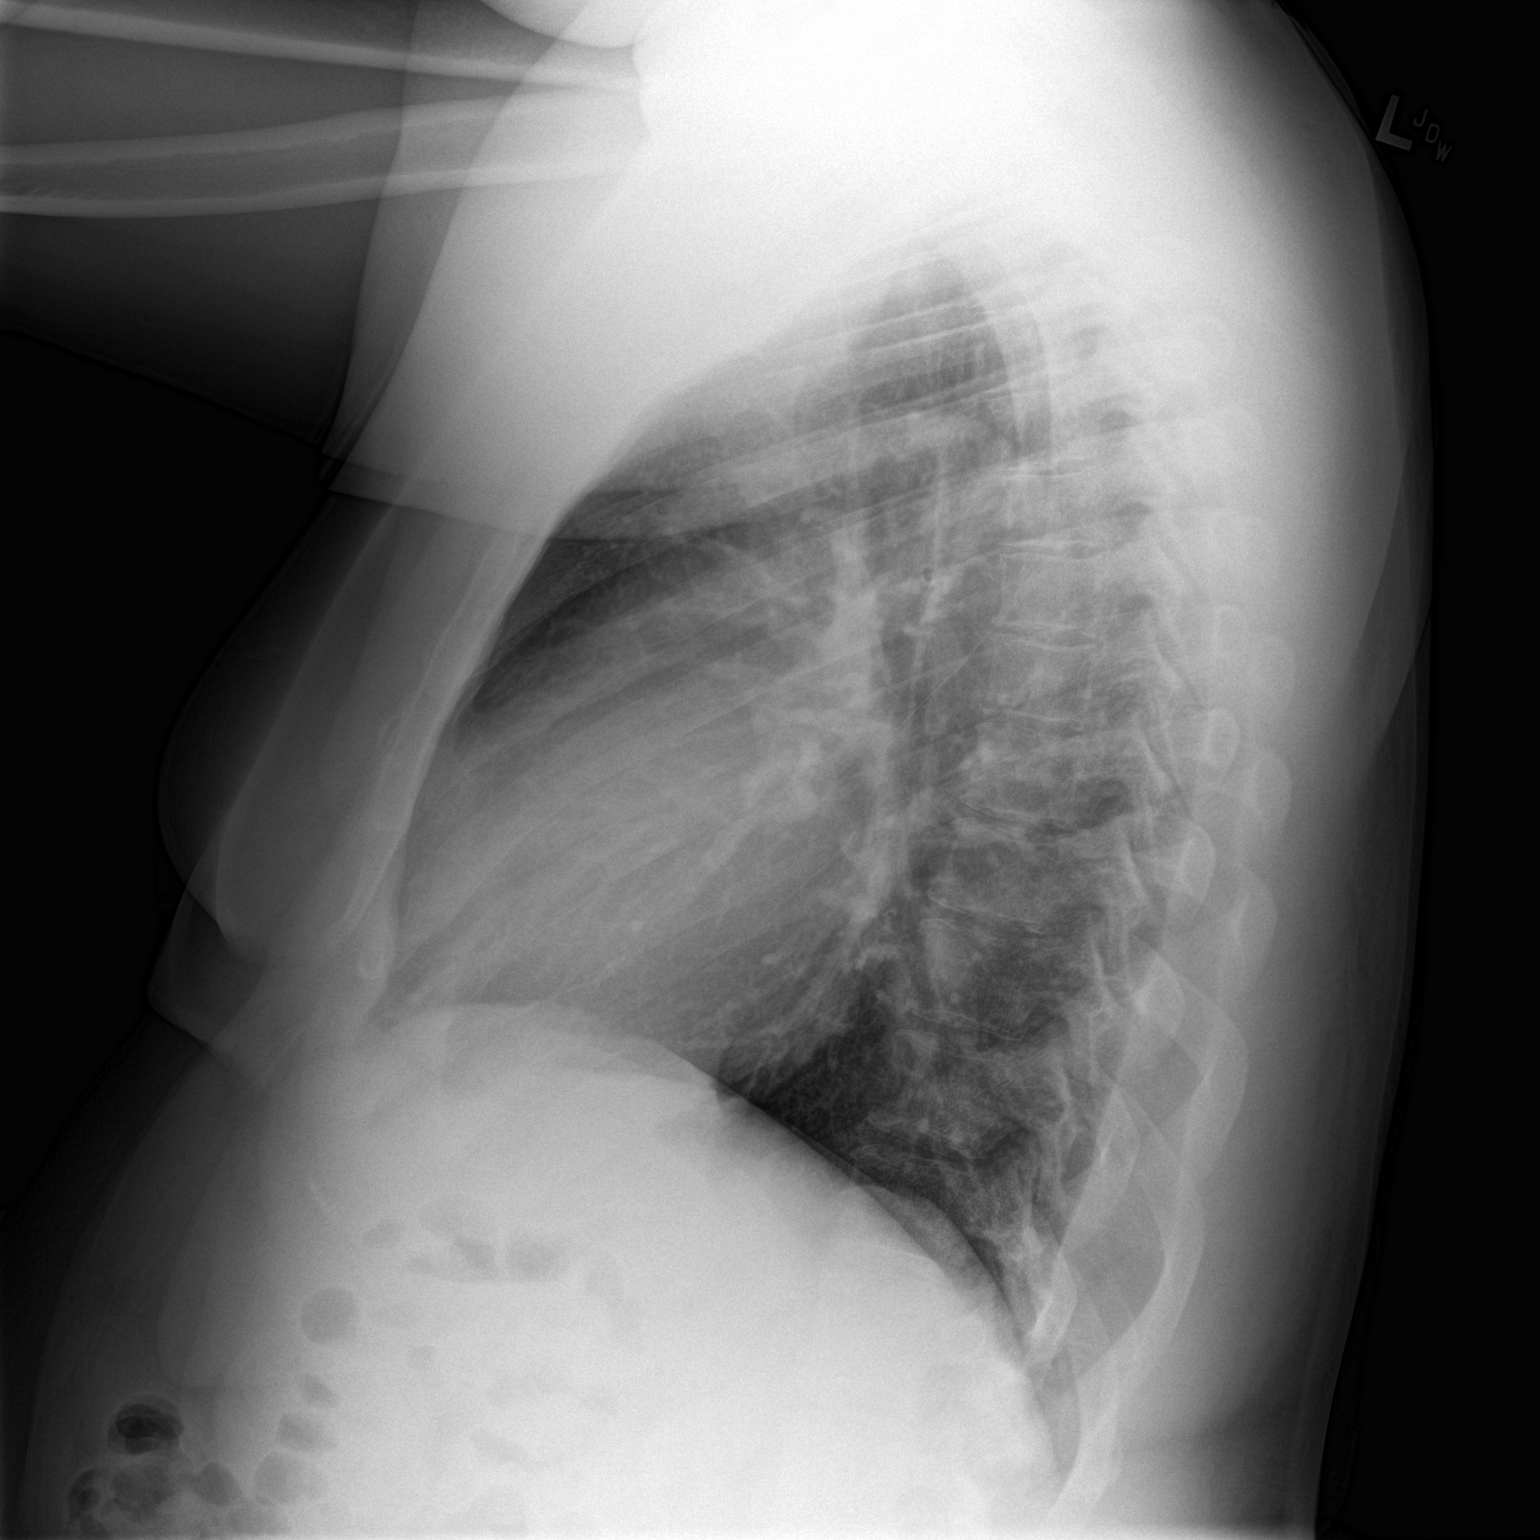

[2 of 2 positions shown; findings below may reference images not displayed]

FINDINGS: Cardiomediastinal silhouette is stable. No acute infiltrate or
pleural effusion. No pulmonary edema. Bony thorax is unremarkable.
IMPRESSION: No active cardiopulmonary disease.

## 2016-12-14 ENCOUNTER — Emergency Department (HOSPITAL_COMMUNITY): Payer: Self-pay

## 2016-12-14 ENCOUNTER — Emergency Department (HOSPITAL_COMMUNITY)
Admission: EM | Admit: 2016-12-14 | Discharge: 2016-12-14 | Disposition: A | Discharge status: Home / Self Care | Payer: Self-pay | Attending: Emergency Medicine | Admitting: Emergency Medicine

## 2016-12-14 ENCOUNTER — Encounter (HOSPITAL_COMMUNITY): Payer: Self-pay | Admitting: Emergency Medicine

## 2016-12-14 DIAGNOSIS — I10 Essential (primary) hypertension: Secondary | ICD-10-CM | POA: Insufficient documentation

## 2016-12-14 DIAGNOSIS — J45909 Unspecified asthma, uncomplicated: Secondary | ICD-10-CM | POA: Insufficient documentation

## 2016-12-14 DIAGNOSIS — Z87891 Personal history of nicotine dependence: Secondary | ICD-10-CM | POA: Insufficient documentation

## 2016-12-14 DIAGNOSIS — M25511 Pain in right shoulder: Secondary | ICD-10-CM | POA: Insufficient documentation

## 2016-12-14 DIAGNOSIS — Z7982 Long term (current) use of aspirin: Secondary | ICD-10-CM | POA: Insufficient documentation

## 2016-12-14 DIAGNOSIS — R002 Palpitations: Secondary | ICD-10-CM | POA: Insufficient documentation

## 2016-12-14 DIAGNOSIS — Z79899 Other long term (current) drug therapy: Secondary | ICD-10-CM | POA: Insufficient documentation

## 2016-12-14 LAB — CBC
HEMATOCRIT: 44.3 % (ref 39.0–52.0)
HEMOGLOBIN: 14.9 g/dL (ref 13.0–17.0)
MCH: 31.6 pg (ref 26.0–34.0)
MCHC: 33.6 g/dL (ref 30.0–36.0)
MCV: 94.1 fL (ref 78.0–100.0)
Platelets: 269 10*3/uL (ref 150–400)
RBC: 4.71 MIL/uL (ref 4.22–5.81)
RDW: 15 % (ref 11.5–15.5)
WBC: 6.9 10*3/uL (ref 4.0–10.5)

## 2016-12-14 LAB — BASIC METABOLIC PANEL
ANION GAP: 10 (ref 5–15)
BUN: 15 mg/dL (ref 6–20)
CHLORIDE: 102 mmol/L (ref 101–111)
CO2: 27 mmol/L (ref 22–32)
Calcium: 9.4 mg/dL (ref 8.9–10.3)
Creatinine, Ser: 1.12 mg/dL (ref 0.61–1.24)
GFR calc Af Amer: >60 mL/min (ref >60)
GLUCOSE: 111 mg/dL — AB (ref 65–99)
POTASSIUM: 3.8 mmol/L (ref 3.5–5.1)
Sodium: 139 mmol/L (ref 135–145)

## 2016-12-14 LAB — POCT I-STAT TROPONIN I
Troponin i, poc: 0 ng/mL (ref 0.00–0.08)
Troponin i, poc: 0 ng/mL (ref 0.00–0.08)

## 2016-12-14 MED ORDER — PREDNISONE 10 MG PO TABS: 40.0000 mg | ORAL_TABLET | Freq: Every day | ORAL | 0 refills | Status: AC

## 2016-12-14 MED ORDER — ACETAMINOPHEN 500 MG PO TABS: 500.0000 mg | ORAL_TABLET | Freq: Three times a day (TID) | ORAL | 0 refills | Status: DC | PRN

## 2016-12-14 NOTE — ED Provider Notes (Signed)
[facility]-EMERGENCY DEPT Provider Note   CSN: [id number] Arrival date & time: 12/14/16  1218     History   Chief Complaint Chief Complaint  Patient presents with  . Chest Pain    HPI Justin Heikes Yaiden Yang. is a 41 y.o. male presenting with right shoulder pain.  Patient states that 2 days ago, he started to have right shoulder discomfort. This began when he woke up. The pain is in the anterior shoulder. Initially, it felt like a pressure, but this sensation has resolved. He reports his arm feels heavier than normal, and has discomfort when he raises his right arm. Pain does not radiate anywhere. Nothing makes it better, nothing makes it worse. He has not taken any medicine to help this pain. He denies injury of the shoulder. He denies numbness or tingling. He reports yesterday had right-sided lower neck/shoulder stiffness, which has resolved. He denies increase in activity recently. He reports that he normally lifts weights, but has not done so in 2 weeks. He does not do physical activity for his job. He used his wife's physical therapy stim on his shoulder and had resolution of his sxs. He denies pain or symptoms on the left side of his body. Reports history of irregular heartbeat and palpitations. He has had these in the past several days, but this is not new. He denies chest pain or difficulty breathing. He denies nausea or vomiting. He is a former smoker, quit 4 years ago. History of hypertension. No personal history of diabetes, or high cholesterol. No significant family cardiac history. He is here because his wife is concerned his symptoms are due to his heart.  HPI  Past Medical History:  Diagnosis Date  . Asthma   . Hypertension   . Irregular heartbeat     Patient Active Problem List   Diagnosis Date Noted  . Asthma 07/06/2016  . Seasonal allergies 07/06/2016  . Sleep apnea 02/29/2016  . Gout 02/10/2015  . Nail bed injury 07/30/2014  . Essential hypertension 05/31/2014  .  Family history of diabetes mellitus (DM) 05/31/2014  . Blurry vision 05/31/2014  . Accelerated hypertension 05/05/2014    History reviewed. No pertinent surgical history.     Home Medications    Prior to Admission medications   Medication Sig Start Date End Date Taking? Authorizing Provider  albuterol (PROVENTIL HFA;VENTOLIN HFA) 108 (90 Base) MCG/ACT inhaler Inhale 2 puffs into the lungs every 4 (four) hours as needed for wheezing or shortness of breath. 07/06/16  Yes Amao, Enobong, MD  amLODipine (NORVASC) 10 MG tablet Take 1 tablet (10 mg total) by mouth daily. 07/06/16  Yes Jaclyn Shaggy, MD  aspirin EC 81 MG tablet Take 81 mg by mouth daily.   Yes [provider]  cetirizine (ZYRTEC) 10 MG tablet Take 1 tablet (10 mg total) by mouth daily. 07/06/16  Yes Jaclyn Shaggy, MD  fluticasone (FLONASE) 50 MCG/ACT nasal spray Place 2 sprays into both nostrils daily. 07/06/16  Yes Jaclyn Shaggy, MD  losartan (COZAAR) 100 MG tablet Take 1 tablet (100 mg total) by mouth daily. 07/06/16  Yes Jaclyn Shaggy, MD  acetaminophen (TYLENOL) 500 MG tablet Take 1 tablet (500 mg total) by mouth every 8 (eight) hours as needed (pain). 12/14/16   Kieli Golladay, PA-C  ibuprofen (ADVIL,MOTRIN) 600 MG tablet Take 1 tablet (600 mg total) by mouth every 8 (eight) hours as needed. Patient not taking: Reported on 12/14/2016 07/19/14   Doris Cheadle, MD  predniSONE (DELTASONE) 10 MG tablet Take  4 tablets (40 mg total) by mouth daily. 12/14/16 12/17/16  Anniya Whiters, PA-C    Family History Family History  Problem Relation Age of Onset  . Hypertension Mother   . Stroke Mother   . Hypertension Father   . Diabetes Father   . Heart disease Father   . Heart attack Paternal Uncle   . Heart attack Paternal Grandmother   . Heart disease Paternal Grandmother   . Heart attack Paternal Grandfather   . Heart disease Paternal Grandfather     Social History Social History  Substance Use Topics  . Smoking  status: Former Smoker    Years: 10.00    Quit date: 04/29/2008  . Smokeless tobacco: Never Used     Comment: quit 3 years ago  . Alcohol use No     Allergies   Lisinopril   Review of Systems Review of Systems  Constitutional: Negative for chills and fever.  Respiratory: Negative for cough, chest tightness and shortness of breath.   Cardiovascular: Positive for palpitations (baseline per patient). Negative for chest pain and leg swelling.  Musculoskeletal: Positive for arthralgias, myalgias, neck pain (resolved) and neck stiffness (resolved). Negative for back pain, gait problem and joint swelling.  Skin: Negative for wound.  Hematological: Does not bruise/bleed easily.    Physical Exam Updated Vital Signs BP (!) 153/111 (BP Location: Right Arm)   Pulse 73   Temp 98.2 F (36.8 C) (Oral)   Resp 18   Ht 6' (1.829 m)   Wt 122.5 kg (270 lb)   SpO2 99%   BMI 36.62 kg/m   Physical Exam  Constitutional: He is oriented to person, place, and time. He appears well-developed and well-nourished. No distress.  HENT:  Head: Normocephalic and atraumatic.  Eyes: Pupils are equal, round, and reactive to light. Conjunctivae and EOM are normal.  Neck: Normal range of motion.  Cardiovascular: Normal rate, regular rhythm and intact distal pulses.   Pulmonary/Chest: Effort normal and breath sounds normal. No respiratory distress. He has no wheezes. He has no rales. He exhibits no tenderness.  Abdominal: Soft. Bowel sounds are normal. He exhibits no distension and no mass. There is no tenderness. There is no rebound and no guarding.  Musculoskeletal: Normal range of motion. He exhibits no edema, tenderness or deformity.       Arms: Range of motion of bilateral shoulders without pain. Grip strength equal bilaterally. Radial pulses equal bilaterally. Sensation intact bilaterally. No obvious contusion, laceration, or injury. No pain with palpation of the shoulder. After manipulation of the  shoulder, patient reports increased soreness and heaviness.  Neurological: He is alert and oriented to person, place, and time. He has normal strength. No sensory deficit. GCS eye subscore is 4. GCS verbal subscore is 5. GCS motor subscore is 6.  Skin: Skin is warm and dry.  Psychiatric: He has a normal mood and affect.  Nursing note and vitals reviewed.    ED Treatments / Results  Labs (all labs ordered are listed, but only abnormal results are displayed) Labs Reviewed  BASIC METABOLIC PANEL - Abnormal; Notable for the following:       Result Value   Glucose, Bld 111 (*)    All other components within normal limits  CBC  I-STAT TROPONIN, ED  POCT I-STAT TROPONIN I  I-STAT TROPONIN, ED  POCT I-STAT TROPONIN I    EKG  EKG Interpretation  Date/Time:  Friday December 14 2016 12:28:37 EDT Ventricular Rate:  75 PR Interval:  QRS Duration: 107 QT Interval:  392 QTC Calculation: 438 R Axis:   62 Text Interpretation:  Sinus rhythm Probable left atrial enlargement Confirmed by Doug Sou [phone]) on 12/14/2016 4:22:22 PM       Radiology Dg Chest 2 View  Result Date: 12/14/2016 CLINICAL DATA:  Right-sided upper chest pain, no known injury, initial encounter EXAM: CHEST  2 VIEW COMPARISON:  04/29/2014 FINDINGS: The heart size and mediastinal contours are within normal limits. Both lungs are clear. The visualized skeletal structures are unremarkable. IMPRESSION: No active cardiopulmonary disease. Electronically Signed   By: Alcide Clever M.D.   On: 12/14/2016 12:59    Procedures Procedures (including critical care time)  Medications Ordered in ED Medications - No data to display   Initial Impression / Assessment and Plan / ED Course  I have reviewed the triage vital signs and the nursing notes.  Pertinent labs & imaging results that were available during my care of the patient were reviewed by me and considered in my medical decision making (see chart for details).      Patient presented with right shoulder pain. Physical exam reassuring, as pain does not extend to the left side of his chest, and patient neurovascularly intact. Blood pressure slightly elevated. Initial labs reassuring, troponin negative, EKG without signs of STEMI. Likely muscular pain. Chest x-ray without obvious deformity of the shoulder. Will order repeat troponin.  Repeat troponin negative. Discussed case with attending, Dr. Ethelda Chick evaluated patient. At this time, patient appears safe for discharge. BP improved. Return precautions given. Patient states he understands and agrees to plan.    Final Clinical Impressions(s) / ED Diagnoses   Final diagnoses:  Acute pain of right shoulder    New Prescriptions Discharge Medication List as of 12/14/2016  5:44 PM    START taking these medications   Details  acetaminophen (TYLENOL) 500 MG tablet Take 1 tablet (500 mg total) by mouth every 8 (eight) hours as needed (pain)., Starting Fri 12/14/2016, Print    predniSONE (DELTASONE) 10 MG tablet Take 4 tablets (40 mg total) by mouth daily., Starting Fri 12/14/2016, Until Mon 12/17/2016, Print         Ehrenberg, Sarika Baldini, PA-C 12/15/16 0151    Doug Sou, MD 12/21/16 [phone]

## 2016-12-14 NOTE — Discharge Instructions (Signed)
You may use tylenol 3 times a day as needed for pain or soreness.  Take prednisone as prescribed.  Make sure you continue to move and use your arm, but do not do any heavy lifting until symptoms are improved.  Follow up with your primary care doctor if your pain is not improving or if your blood pressure remains high.  Return to the ER if you have numbness of your hand, you start dropping things accidentally from your hand, you have spread of pain to the left side of your chest, or you develop any new or worsening symptoms.

## 2016-12-14 NOTE — ED Triage Notes (Signed)
Pt reports he began to have R upper CP near shoulder yesterday. Tried muscle massage machine on this spot which helped. Woke up with same pain again today and wanted to be checked out. No SOB, dizziness or lightheadedness. Pt reports his arm has felt like dead weight with this, but has full ROM and grip strength.

## 2016-12-14 NOTE — ED Notes (Signed)
CORRECTION RX X 2 GIVEN TO PT

## 2016-12-14 NOTE — ED Notes (Signed)
EDPA SOPHIA STATES PT IS OKAY FOR DISCHARGE

## 2016-12-14 NOTE — ED Notes (Signed)
ED Provider at bedside. 

## 2016-12-14 NOTE — ED Provider Notes (Signed)
Complains of right anterior shoulder pain and points to lateral aspect of pectoralis muscle and deltoid muscle onset 2 days ago pain is worse with moving his shoulder and improved with remaining still. He'll trouble moving his shoulder this morning. He denies any shortness of breath nausea or sweatiness. He feels much improved presently. He denies any fever denies trauma denies shortness of breath no other associated symptoms and he is alert and in no distress lungs clear auscultation. Heart regular rate and rhythm abdomen nondistended nontender   Doug Sou, MD 12/14/16 [phone]

## 2016-12-26 MED FILL — AMLODIPINE BESYLATE 10 MG T: 10 | 30 days supply | Qty: 30 | Fill #1

## 2016-12-26 MED FILL — LOSARTAN POTASSIUM 100 MG T: 100 | 30 days supply | Qty: 30 | Fill #1

## 2017-01-02 ENCOUNTER — Emergency Department (HOSPITAL_COMMUNITY): Payer: Self-pay

## 2017-01-02 ENCOUNTER — Emergency Department (HOSPITAL_BASED_OUTPATIENT_CLINIC_OR_DEPARTMENT_OTHER): Admit: 2017-01-02 | Discharge: 2017-01-02 | Disposition: A | Discharge status: Home / Self Care | Payer: Self-pay

## 2017-01-02 ENCOUNTER — Emergency Department (HOSPITAL_COMMUNITY)
Admission: EM | Admit: 2017-01-02 | Discharge: 2017-01-02 | Disposition: A | Discharge status: Home / Self Care | Payer: Self-pay | Attending: Emergency Medicine | Admitting: Emergency Medicine

## 2017-01-02 DIAGNOSIS — J45909 Unspecified asthma, uncomplicated: Secondary | ICD-10-CM | POA: Insufficient documentation

## 2017-01-02 DIAGNOSIS — I10 Essential (primary) hypertension: Secondary | ICD-10-CM | POA: Insufficient documentation

## 2017-01-02 DIAGNOSIS — Z79899 Other long term (current) drug therapy: Secondary | ICD-10-CM | POA: Insufficient documentation

## 2017-01-02 DIAGNOSIS — M79672 Pain in left foot: Secondary | ICD-10-CM | POA: Insufficient documentation

## 2017-01-02 DIAGNOSIS — Z87891 Personal history of nicotine dependence: Secondary | ICD-10-CM | POA: Insufficient documentation

## 2017-01-02 DIAGNOSIS — Z7982 Long term (current) use of aspirin: Secondary | ICD-10-CM | POA: Insufficient documentation

## 2017-01-02 DIAGNOSIS — M79609 Pain in unspecified limb: Secondary | ICD-10-CM

## 2017-01-02 MED ORDER — HYDROCODONE-ACETAMINOPHEN 5-325 MG PO TABS
1.0000 | ORAL_TABLET | Freq: Once | ORAL | Status: AC
  Administered 2017-01-02: 1 via ORAL
  Filled 2017-01-02: qty 1

## 2017-01-02 MED ORDER — HYDROCODONE-ACETAMINOPHEN 5-325 MG PO TABS: 1.0000 | ORAL_TABLET | Freq: Once | ORAL | Status: DC

## 2017-01-02 MED ORDER — COLCHICINE 0.6 MG PO TABS: 0.6000 mg | ORAL_TABLET | Freq: Two times a day (BID) | ORAL | 0 refills | Status: DC

## 2017-01-02 NOTE — ED Triage Notes (Signed)
Pt with LLE edema and pain / tingling x 2 days, pt states pain is worsening and tingling worsening. Pt states he has had gout in the past but this pain is much worse. Has attempted OTC pain meds but no relief. Denies injury. Pt states difficulty walking.

## 2017-01-02 NOTE — ED Notes (Signed)
Pt ambulatory and independent at discharge.  Verbalized understanding of discharge instructions 

## 2017-01-02 NOTE — ED Notes (Signed)
Patient transported to X-ray 

## 2017-01-02 NOTE — Discharge Instructions (Signed)
The x-ray of the left foot was normal. No clot was found on the ultrasound. Your symptoms are consistent with a gout flare given your history.  Please take ibuprofen at home for your pain. You can take 600mg  every 6hrs. You can use crutches which we have given you in the ER to help you get around for the next few days.   Please schedule an appointment with your primary doctor to follow up on your left foot and ankle pain today.   Return to the ER if you have numbness which spreads to your ankle or calf, or if you develop a fever greater than 100.30F. Please also return for any new or worsening symptoms.

## 2017-01-02 NOTE — ED Provider Notes (Signed)
[facility]-EMERGENCY DEPT Provider Note   CSN: [id number] Arrival date & time: 01/02/17  1124     History   Chief Complaint Chief Complaint  Patient [id number] with  . Foot Pain    L foot  . Leg Pain    LLE    HPI Justin AlkenMichael Rennard Timoteo ExposeWelch Jr. is a 41 y.o. male.  HPI   Mr. Webb SilversmithWelch is a 41 year old male with a history of gout, hypertension asthma who presents the emergency department for evaluation of left foot swelling and pain. He states that his symptoms started 2 days ago. No inciting injury. Reports worsening left calf and foot swelling which feels "tight and pressure-like." He also endorses 2nd-4th toe tingling. At this time he states the pain is constant and 8/10 in severity at rest, but a 10/10 with ambulation. States that he is no longer able to bear weight on the left foot due to pain. Tried taking ibuprofen for his symptoms without significant relief. At first he thought he was having a gout flare in the left ankle as he has had gout in this ankle in the past. He thinks his pain and swelling this time are much worse than his previous gout flare. He denies recent surgery, prolonged immobility, history of cancer. Denies history of a DVT. Denies SOB, chest pain, fever, pain elsewhere.   Past Medical History:  Diagnosis Date  . Asthma   . Hypertension   . Irregular heartbeat     Patient Active Problem List   Diagnosis Date Noted  . Asthma 07/06/2016  . Seasonal allergies 07/06/2016  . Sleep apnea 02/29/2016  . Gout 02/10/2015  . Nail bed injury 07/30/2014  . Essential hypertension 05/31/2014  . Family history of diabetes mellitus (DM) 05/31/2014  . Blurry vision 05/31/2014  . Accelerated hypertension 05/05/2014    No past surgical history on file.     Home Medications    Prior to Admission medications   Medication Sig Start Date End Date Taking? Authorizing Provider  acetaminophen (TYLENOL) 500 MG tablet Take 1 tablet (500 mg total) by mouth  every 8 (eight) hours as needed (pain). 12/14/16  Yes Caccavale, Sophia, PA-C  albuterol (PROVENTIL HFA;VENTOLIN HFA) 108 (90 Base) MCG/ACT inhaler Inhale 2 puffs into the lungs every 4 (four) hours as needed for wheezing or shortness of breath. 07/06/16  Yes Amao, Enobong, MD  amLODipine (NORVASC) 10 MG tablet Take 1 tablet (10 mg total) by mouth daily. 07/06/16  Yes Jaclyn ShaggyAmao, Enobong, MD  aspirin EC 81 MG tablet Take 81 mg by mouth daily.   Yes [provider]  cetirizine (ZYRTEC) 10 MG tablet Take 1 tablet (10 mg total) by mouth daily. 07/06/16  Yes Jaclyn ShaggyAmao, Enobong, MD  fluticasone (FLONASE) 50 MCG/ACT nasal spray Place 2 sprays into both nostrils daily. 07/06/16  Yes Jaclyn ShaggyAmao, Enobong, MD  losartan (COZAAR) 100 MG tablet Take 1 tablet (100 mg total) by mouth daily. 07/06/16  Yes Jaclyn ShaggyAmao, Enobong, MD  ibuprofen (ADVIL,MOTRIN) 600 MG tablet Take 1 tablet (600 mg total) by mouth every 8 (eight) hours as needed. Patient not taking: Reported on 12/14/2016 07/19/14   Doris CheadleAdvani, Deepak, MD    Family History Family History  Problem Relation Age of Onset  . Hypertension Mother   . Stroke Mother   . Hypertension Father   . Diabetes Father   . Heart disease Father   . Heart attack Paternal Uncle   . Heart attack Paternal Grandmother   . Heart disease Paternal Grandmother   .  Heart attack Paternal Grandfather   . Heart disease Paternal Grandfather     Social History Social History  Substance Use Topics  . Smoking status: Former Smoker    Years: 10.00    Quit date: 04/29/2008  . Smokeless tobacco: Never Used     Comment: quit 3 years ago  . Alcohol use No     Allergies   Watermelon [citrullus vulgaris] and Lisinopril   Review of Systems Review of Systems  Constitutional: Negative for chills, fatigue and fever.  Respiratory: Negative for shortness of breath.   Gastrointestinal: Negative for abdominal pain, diarrhea, nausea and vomiting.  Musculoskeletal: Positive for arthralgias (left foot), gait  problem (unable to walk due to pain), joint swelling (left foot) and myalgias (left calf). Negative for back pain.  Skin: Negative for color change, rash and wound.  Neurological: Positive for numbness. Negative for weakness and headaches.  Psychiatric/Behavioral: Negative for agitation.  All other systems reviewed and are negative.    Physical Exam Updated Vital Signs BP (!) 135/92 (BP Location: Right Arm)   Pulse 66   Temp 98.4 F (36.9 C) (Oral)   Resp 18   SpO2 100%   Physical Exam  Constitutional: He is oriented to person, place, and time. He appears well-developed and well-nourished. No distress.  HENT:  Head: Normocephalic and atraumatic.  Mouth/Throat: Oropharynx is clear and moist. No oropharyngeal exudate.  Eyes: Pupils are equal, round, and reactive to light. Conjunctivae are normal. Right eye exhibits no discharge. Left eye exhibits no discharge.  Neck: Normal range of motion. Neck supple.  Cardiovascular: Normal rate and regular rhythm.  Exam reveals no friction rub.   Murmur (systolic) heard. Pulmonary/Chest: Effort normal and breath sounds normal. No respiratory distress. He has no wheezes. He has no rales.  Abdominal: Soft. Bowel sounds are normal. He exhibits no mass. There is no tenderness. There is no guarding.  Musculoskeletal: Normal range of motion.  Dorsum of left foot swollen. No swelling in the calf. No pitting edema. Tenderness over lateral and medial malleolus of the left ankle and over the dorsum of the foot. Limited active dorsiflexion and plantar flexion in the left ankle due to pain. Full active ROM of left knee. No calf pain. No overlying erythema, warmth, ecchymosis of the left calf, ankle or foot. Strength 5/5 in bilateral LE. DP and PT pulses 2+ bilaterally.   Neurological: He is alert and oriented to person, place, and time. Coordination normal.  Distal sensation to sharp/light touch intact in left LE. Patellar reflex 2+ bilaterally. Patient limps  with gait due to pain.   Skin: Skin is warm and dry. Capillary refill takes less than 2 seconds. He is not diaphoretic.  Psychiatric: He has a normal mood and affect. His behavior is normal.  Nursing note and vitals reviewed.    ED Treatments / Results  Labs (all labs ordered are listed, but only abnormal results are displayed) Labs Reviewed - No data to display  EKG  EKG Interpretation None       Radiology Dg Foot Complete Left  Result Date: 01/02/2017 CLINICAL DATA:  Pt states general and increasing total left foot pain x 2 days without injury - pt states previous hx of gout, but states this encounter is more intense EXAM: LEFT FOOT - COMPLETE 3+ VIEW COMPARISON:  None. FINDINGS: No acute fracture or dislocation. Joint spaces maintained. Suspect mild diffuse soft tissue swelling. IMPRESSION: No acute osseous abnormality. Electronically Signed   By: Hosie Spangle.D.  On: 01/02/2017 16:31    Procedures Procedures (including critical care time)  Medications Ordered in ED Medications  HYDROcodone-acetaminophen (NORCO/VICODIN) 5-325 MG per tablet 1 tablet (1 tablet Oral Given 01/02/17 1335)     Initial Impression / Assessment and Plan / ED Course  I have reviewed the triage vital signs and the nursing notes.  Pertinent labs & imaging results that were available during my care of the patient were reviewed by me and considered in my medical decision making (see chart for details).  Clinical Course as of Jan 03 1708  Wed Jan 02, 2017  1656 On recheck patient states his pain is improved. Tried to stand, but states it is painful and he is requesting crutches for the next few days.   [ES]    Clinical Course User Index [ES] Kellie Shropshire, PA-C    Patient presents with left foot pain and swelling. Venous US reveals no DVT, Xray of left foot normal. Exam non-concerning for arterial occlusion given pulses 2+ bilaterally, sensation intact, distal LE warm and cap refill <2sec.  Low suspicion for septic joint given afebrile, no erythema, swelling and distribution of pain. This is likely a gout flare given patients history of flares in the left ankle. Will treat with NSAIDs and send patient home with crutches given he has pain and must limp to ambulate. Have discussed follow up with his primary care doctor. Patient agrees and voices understanding. Discussed this patient with Dr. Rubin Payor who also agrees with the above plan.   Final Clinical Impressions(s) / ED Diagnoses   Final diagnoses:  Left foot pain    New Prescriptions New Prescriptions   No medications on file     Lawrence Marseilles 01/03/17 0920    Benjiman Core, MD 01/03/17 [phone]

## 2017-01-02 NOTE — Progress Notes (Signed)
*  PRELIMINARY RESULTS* Vascular Ultrasound Left lower extremity venous duplex has been completed.  Preliminary findings: No evidence of deep vein thrombosis or baker's cyst in the left lower extremity.  Preliminary results given to provider @ 14:40   Chauncey FischerCharlotte C Dewayne Jurek 01/02/2017, 2:40 PM

## 2017-01-18 ENCOUNTER — Ambulatory Visit: Payer: Self-pay | Admitting: Family Medicine

## 2017-02-15 MED FILL — AMLODIPINE BESYLATE 10 MG T: 10 | 30 days supply | Qty: 30 | Fill #2

## 2017-03-13 ENCOUNTER — Other Ambulatory Visit: Payer: Self-pay | Admitting: Family Medicine

## 2018-06-29 ENCOUNTER — Emergency Department (HOSPITAL_COMMUNITY): Payer: Self-pay

## 2018-06-29 ENCOUNTER — Emergency Department (HOSPITAL_COMMUNITY)
Admission: EM | Admit: 2018-06-29 | Discharge: 2018-06-29 | Disposition: A | Discharge status: Home / Self Care | Payer: Self-pay | Attending: Emergency Medicine | Admitting: Emergency Medicine

## 2018-06-29 ENCOUNTER — Other Ambulatory Visit: Payer: Self-pay

## 2018-06-29 ENCOUNTER — Encounter (HOSPITAL_COMMUNITY): Payer: Self-pay

## 2018-06-29 DIAGNOSIS — Z87891 Personal history of nicotine dependence: Secondary | ICD-10-CM | POA: Insufficient documentation

## 2018-06-29 DIAGNOSIS — I10 Essential (primary) hypertension: Secondary | ICD-10-CM | POA: Insufficient documentation

## 2018-06-29 DIAGNOSIS — J45901 Unspecified asthma with (acute) exacerbation: Secondary | ICD-10-CM | POA: Insufficient documentation

## 2018-06-29 DIAGNOSIS — Z79899 Other long term (current) drug therapy: Secondary | ICD-10-CM | POA: Insufficient documentation

## 2018-06-29 MED ORDER — ALBUTEROL SULFATE HFA 108 (90 BASE) MCG/ACT IN AERS
2.0000 | INHALATION_SPRAY | Freq: Once | RESPIRATORY_TRACT | Status: AC
  Administered 2018-06-29: 2 via RESPIRATORY_TRACT
  Filled 2018-06-29: qty 6.7

## 2018-06-29 MED ORDER — PREDNISONE 20 MG PO TABS
60.0000 mg | ORAL_TABLET | Freq: Once | ORAL | Status: AC
  Administered 2018-06-29: 60 mg via ORAL
  Filled 2018-06-29: qty 3

## 2018-06-29 MED ORDER — PREDNISONE 10 MG PO TABS: ORAL_TABLET | ORAL | 0 refills | Status: DC

## 2018-06-29 NOTE — ED Notes (Signed)
Patient verbalizes understanding of discharge instructions. Opportunity for questioning and answers were provided. Armband removed by staff, pt discharged from ED. Pt ambulatory to lobby. Pharmacy and prescription reviewed.

## 2018-06-29 NOTE — ED Provider Notes (Signed)
[facility] Provider Note   CSN: [id number] Arrival date & time: 06/29/18  2026    History   Chief Complaint Chief Complaint  Patient presents with  . Shortness of Breath    HPI Justin AlkenMichael Rennard Timoteo ExposeWelch Jr. is a 43 y.o. male.     The history is provided by the patient. No language interpreter was used.  Shortness of Breath  Severity:  Moderate Onset quality:  Gradual Timing:  Constant Progression:  Worsening Chronicity:  New Context: pollens   Relieved by:  Nothing Ineffective treatments:  Inhaler Associated symptoms: wheezing   Pt reports pollen is bothering him.  Pt reports using albuterol without relief   Past Medical History:  Diagnosis Date  . Asthma   . Hypertension   . Irregular heartbeat     Patient Active Problem List   Diagnosis Date Noted  . Asthma 07/06/2016  . Seasonal allergies 07/06/2016  . Sleep apnea 02/29/2016  . Gout 02/10/2015  . Nail bed injury 07/30/2014  . Essential hypertension 05/31/2014  . Family history of diabetes mellitus (DM) 05/31/2014  . Blurry vision 05/31/2014  . Accelerated hypertension 05/05/2014    History reviewed. No pertinent surgical history.      Home Medications    Prior to Admission medications   Medication Sig Start Date End Date Taking? Authorizing Provider  acetaminophen (TYLENOL) 500 MG tablet Take 1 tablet (500 mg total) by mouth every 8 (eight) hours as needed (pain). 12/14/16   Caccavale, Sophia, PA-C  albuterol (PROVENTIL HFA;VENTOLIN HFA) 108 (90 Base) MCG/ACT inhaler Inhale 2 puffs into the lungs every 4 (four) hours as needed for wheezing or shortness of breath. 07/06/16   Hoy RegisterNewlin, Enobong, MD  amLODipine (NORVASC) 10 MG tablet Take 1 tablet (10 mg total) by mouth daily. 07/06/16   Hoy RegisterNewlin, Enobong, MD  aspirin EC 81 MG tablet Take 81 mg by mouth daily.    [provider]  cetirizine (ZYRTEC) 10 MG tablet Take 1 tablet (10 mg total) by mouth daily. 07/06/16    Hoy RegisterNewlin, Enobong, MD  colchicine 0.6 MG tablet Take 1 tablet (0.6 mg total) by mouth 2 (two) times daily. 01/02/17 01/09/17  Kellie ShropshireShrosbree, Emily J, PA-C  fluticasone (FLONASE) 50 MCG/ACT nasal spray Place 2 sprays into both nostrils daily. 07/06/16   Hoy RegisterNewlin, Enobong, MD  ibuprofen (ADVIL,MOTRIN) 600 MG tablet Take 1 tablet (600 mg total) by mouth every 8 (eight) hours as needed. Patient not taking: Reported on 12/14/2016 07/19/14   Doris CheadleAdvani, Deepak, MD  losartan (COZAAR) 100 MG tablet Take 1 tablet (100 mg total) by mouth daily. 07/06/16   Hoy RegisterNewlin, Enobong, MD    Family History Family History  Problem Relation Age of Onset  . Hypertension Mother   . Stroke Mother   . Hypertension Father   . Diabetes Father   . Heart disease Father   . Heart attack Paternal Uncle   . Heart attack Paternal Grandmother   . Heart disease Paternal Grandmother   . Heart attack Paternal Grandfather   . Heart disease Paternal Grandfather     Social History Social History   Tobacco Use  . Smoking status: Former Smoker    Years: 10.00    Last attempt to quit: 04/29/2008    Years since quitting: 10.1  . Smokeless tobacco: Never Used  . Tobacco comment: quit 3 years ago  Substance Use Topics  . Alcohol use: No  . Drug use: No     Allergies   Watermelon [citrullus  vulgaris] and Lisinopril   Review of Systems Review of Systems  Respiratory: Positive for shortness of breath and wheezing.   All other systems reviewed and are negative.    Physical Exam Updated Vital Signs BP (!) 138/102   Pulse 97   Temp 98.6 F (37 C) (Oral)   Resp 18   SpO2 98%   Physical Exam Vitals signs and nursing note reviewed.  Constitutional:      Appearance: He is well-developed.  HENT:     Head: Normocephalic.  Neck:     Musculoskeletal: Normal range of motion.  Pulmonary:     Effort: Pulmonary effort is normal.     Breath sounds: Examination of the right-upper field reveals wheezing and rhonchi. Examination of the  left-upper field reveals wheezing and rhonchi. Examination of the right-middle field reveals wheezing. Examination of the left-middle field reveals wheezing. Examination of the right-lower field reveals wheezing. Examination of the left-lower field reveals wheezing. Wheezing and rhonchi present.  Abdominal:     General: There is no distension.     Palpations: Abdomen is soft.  Musculoskeletal: Normal range of motion.  Skin:    General: Skin is warm.  Neurological:     Mental Status: He is alert and oriented to person, place, and time.      ED Treatments / Results  Labs (all labs ordered are listed, but only abnormal results are displayed) Labs Reviewed - No data to display  EKG None  Radiology Dg Chest [facility] 1 View  Result Date: 06/29/2018 CLINICAL DATA:  Shortness of breath EXAM: PORTABLE CHEST 1 VIEW COMPARISON:  12/14/2016 FINDINGS: The heart size and mediastinal contours are within normal limits. Both lungs are clear. The visualized skeletal structures are unremarkable. IMPRESSION: No active disease. Electronically Signed   By: Deatra Robinson M.D.   On: 06/29/2018 22:09    Procedures Procedures (including critical care time)  Medications Ordered in ED Medications  albuterol (VENTOLIN HFA) 108 (90 Base) MCG/ACT inhaler 2 puff (2 puffs Inhalation Given 06/29/18 2133)     Initial Impression / Assessment and Plan / ED Course  I have reviewed the triage vital signs and the nursing notes.  Pertinent labs & imaging results that were available during my care of the patient were reviewed by me and considered in my medical decision making (see chart for details).        MDM  Chest xray no pneumonia.   Pt given albuterol here.  Prednisone 60 mg.  Rx for 5 day taper.  Pt has had no exposures, no fever,  No covid risk.    Final Clinical Impressions(s) / ED Diagnoses   Final diagnoses:  Moderate asthma with exacerbation, unspecified whether persistent    ED Discharge Orders          Ordered    predniSONE (DELTASONE) 10 MG tablet     06/29/18 2226        An After Visit Summary was printed and given to the patient.    Osie Cheeks 06/29/18 2226    Eber Hong, MD 06/30/18 [phone]

## 2018-06-29 NOTE — ED Triage Notes (Signed)
Pt arrives POV for asthma flare up x2 days. Pt reports SOB on exertion. Pt using inhaler at home with minimal relief. Pt denies fevers, chills, cough, travel, or contact with sick persons. Pt hypertensive in triage, has not taken night BP medication yet.

## 2018-06-29 NOTE — ED Notes (Signed)
ED Provider at bedside. 

## 2018-06-29 NOTE — ED Notes (Signed)
XR at bedside

## 2018-07-31 ENCOUNTER — Other Ambulatory Visit: Payer: Self-pay

## 2018-07-31 ENCOUNTER — Encounter (HOSPITAL_COMMUNITY): Payer: Self-pay | Admitting: Emergency Medicine

## 2018-07-31 ENCOUNTER — Observation Stay (HOSPITAL_COMMUNITY)
Admission: EM | Admit: 2018-07-31 | Discharge: 2018-08-02 | Disposition: A | Discharge status: Home / Self Care | Payer: Self-pay | Attending: Internal Medicine | Admitting: Internal Medicine

## 2018-07-31 DIAGNOSIS — Z7951 Long term (current) use of inhaled steroids: Secondary | ICD-10-CM | POA: Insufficient documentation

## 2018-07-31 DIAGNOSIS — E860 Dehydration: Secondary | ICD-10-CM | POA: Insufficient documentation

## 2018-07-31 DIAGNOSIS — G4730 Sleep apnea, unspecified: Secondary | ICD-10-CM | POA: Insufficient documentation

## 2018-07-31 DIAGNOSIS — E1310 Other specified diabetes mellitus with ketoacidosis without coma: Secondary | ICD-10-CM

## 2018-07-31 DIAGNOSIS — N179 Acute kidney failure, unspecified: Secondary | ICD-10-CM | POA: Insufficient documentation

## 2018-07-31 DIAGNOSIS — Z79899 Other long term (current) drug therapy: Secondary | ICD-10-CM | POA: Insufficient documentation

## 2018-07-31 DIAGNOSIS — E119 Type 2 diabetes mellitus without complications: Secondary | ICD-10-CM

## 2018-07-31 DIAGNOSIS — Z87891 Personal history of nicotine dependence: Secondary | ICD-10-CM | POA: Insufficient documentation

## 2018-07-31 DIAGNOSIS — Z1159 Encounter for screening for other viral diseases: Secondary | ICD-10-CM | POA: Insufficient documentation

## 2018-07-31 DIAGNOSIS — E1110 Type 2 diabetes mellitus with ketoacidosis without coma: Principal | ICD-10-CM | POA: Insufficient documentation

## 2018-07-31 DIAGNOSIS — J45909 Unspecified asthma, uncomplicated: Secondary | ICD-10-CM | POA: Insufficient documentation

## 2018-07-31 DIAGNOSIS — Z7982 Long term (current) use of aspirin: Secondary | ICD-10-CM | POA: Insufficient documentation

## 2018-07-31 DIAGNOSIS — Z833 Family history of diabetes mellitus: Secondary | ICD-10-CM | POA: Insufficient documentation

## 2018-07-31 DIAGNOSIS — I10 Essential (primary) hypertension: Secondary | ICD-10-CM | POA: Insufficient documentation

## 2018-07-31 DIAGNOSIS — E785 Hyperlipidemia, unspecified: Secondary | ICD-10-CM | POA: Insufficient documentation

## 2018-07-31 DIAGNOSIS — Z8249 Family history of ischemic heart disease and other diseases of the circulatory system: Secondary | ICD-10-CM | POA: Insufficient documentation

## 2018-07-31 DIAGNOSIS — M109 Gout, unspecified: Secondary | ICD-10-CM | POA: Insufficient documentation

## 2018-07-31 DIAGNOSIS — E875 Hyperkalemia: Secondary | ICD-10-CM | POA: Insufficient documentation

## 2018-07-31 DIAGNOSIS — E0810 Diabetes mellitus due to underlying condition with ketoacidosis without coma: Secondary | ICD-10-CM

## 2018-07-31 NOTE — ED Triage Notes (Signed)
Pt c/o frequent urination, numbness in his fingers, dry mouth, and trouble with his vision x 5 days

## 2018-07-31 NOTE — ED Notes (Signed)
Urine sample in triage if ordered.

## 2018-08-01 DIAGNOSIS — E1110 Type 2 diabetes mellitus with ketoacidosis without coma: Secondary | ICD-10-CM | POA: Diagnosis present

## 2018-08-01 DIAGNOSIS — E119 Type 2 diabetes mellitus without complications: Secondary | ICD-10-CM

## 2018-08-01 DIAGNOSIS — E0810 Diabetes mellitus due to underlying condition with ketoacidosis without coma: Secondary | ICD-10-CM

## 2018-08-01 DIAGNOSIS — E860 Dehydration: Secondary | ICD-10-CM

## 2018-08-01 DIAGNOSIS — E1310 Other specified diabetes mellitus with ketoacidosis without coma: Secondary | ICD-10-CM

## 2018-08-01 LAB — URINALYSIS, ROUTINE W REFLEX MICROSCOPIC
Bacteria, UA: NONE SEEN
Bilirubin Urine: NEGATIVE
Glucose, UA: >=500 mg/dL — AB
Hgb urine dipstick: NEGATIVE
Ketones, ur: 80 mg/dL — AB
Leukocytes,Ua: NEGATIVE
Nitrite: NEGATIVE
Protein, ur: NEGATIVE mg/dL
Specific Gravity, Urine: 1.03 (ref 1.005–1.030)
pH: 5 (ref 5.0–8.0)

## 2018-08-01 LAB — BASIC METABOLIC PANEL
Anion gap: 18 — ABNORMAL HIGH (ref 5–15)
Anion gap: 16 — ABNORMAL HIGH (ref 5–15)
Anion gap: 12 (ref 5–15)
BUN: 18 mg/dL (ref 6–20)
BUN: 15 mg/dL (ref 6–20)
BUN: 8 mg/dL (ref 6–20)
CO2: 20 mmol/L — ABNORMAL LOW (ref 22–32)
CO2: 20 mmol/L — ABNORMAL LOW (ref 22–32)
CO2: 25 mmol/L (ref 22–32)
Calcium: 10.1 mg/dL (ref 8.9–10.3)
Calcium: 9.4 mg/dL (ref 8.9–10.3)
Calcium: 9.4 mg/dL (ref 8.9–10.3)
Chloride: 96 mmol/L — ABNORMAL LOW (ref 98–111)
Chloride: 103 mmol/L (ref 98–111)
Chloride: 102 mmol/L (ref 98–111)
Creatinine, Ser: 1.39 mg/dL — ABNORMAL HIGH (ref 0.61–1.24)
Creatinine, Ser: 1.23 mg/dL (ref 0.61–1.24)
Creatinine, Ser: 0.97 mg/dL (ref 0.61–1.24)
GFR calc Af Amer: >60 mL/min (ref >60)
GFR calc Af Amer: >60 mL/min (ref >60)
GFR calc Af Amer: >60 mL/min (ref >60)
GFR calc non Af Amer: >60 mL/min (ref >60)
GFR calc non Af Amer: >60 mL/min (ref >60)
GFR calc non Af Amer: >60 mL/min (ref >60)
Glucose, Bld: 608 mg/dL (ref 70–99)
Glucose, Bld: 442 mg/dL — ABNORMAL HIGH (ref 70–99)
Glucose, Bld: 159 mg/dL — ABNORMAL HIGH (ref 70–99)
Potassium: 5.3 mmol/L — ABNORMAL HIGH (ref 3.5–5.1)
Potassium: 5.5 mmol/L — ABNORMAL HIGH (ref 3.5–5.1)
Potassium: 3.2 mmol/L — ABNORMAL LOW (ref 3.5–5.1)
Sodium: 134 mmol/L — ABNORMAL LOW (ref 135–145)
Sodium: 139 mmol/L (ref 135–145)
Sodium: 139 mmol/L (ref 135–145)

## 2018-08-01 LAB — GLUCOSE, CAPILLARY
Glucose-Capillary: 331 mg/dL — ABNORMAL HIGH (ref 70–99)
Glucose-Capillary: 301 mg/dL — ABNORMAL HIGH (ref 70–99)
Glucose-Capillary: 309 mg/dL — ABNORMAL HIGH (ref 70–99)
Glucose-Capillary: 246 mg/dL — ABNORMAL HIGH (ref 70–99)
Glucose-Capillary: 194 mg/dL — ABNORMAL HIGH (ref 70–99)
Glucose-Capillary: 155 mg/dL — ABNORMAL HIGH (ref 70–99)
Glucose-Capillary: 137 mg/dL — ABNORMAL HIGH (ref 70–99)
Glucose-Capillary: 168 mg/dL — ABNORMAL HIGH (ref 70–99)
Glucose-Capillary: 152 mg/dL — ABNORMAL HIGH (ref 70–99)
Glucose-Capillary: 165 mg/dL — ABNORMAL HIGH (ref 70–99)
Glucose-Capillary: 309 mg/dL — ABNORMAL HIGH (ref 70–99)

## 2018-08-01 LAB — CBC WITH DIFFERENTIAL/PLATELET
Abs Immature Granulocytes: 0.02 10*3/uL (ref 0.00–0.07)
Basophils Absolute: 0 10*3/uL (ref 0.0–0.1)
Basophils Relative: 0 %
Eosinophils Absolute: 0 10*3/uL (ref 0.0–0.5)
Eosinophils Relative: 0 %
HCT: 44.8 % (ref 39.0–52.0)
Hemoglobin: 15.3 g/dL (ref 13.0–17.0)
Immature Granulocytes: 0 %
Lymphocytes Relative: 34 %
Lymphs Abs: 2.6 10*3/uL (ref 0.7–4.0)
MCH: 31.2 pg (ref 26.0–34.0)
MCHC: 34.2 g/dL (ref 30.0–36.0)
MCV: 91.2 fL (ref 80.0–100.0)
Monocytes Absolute: 0.5 10*3/uL (ref 0.1–1.0)
Monocytes Relative: 6 %
Neutro Abs: 4.5 10*3/uL (ref 1.7–7.7)
Neutrophils Relative %: 60 %
Platelets: 280 10*3/uL (ref 150–400)
RBC: 4.91 MIL/uL (ref 4.22–5.81)
RDW: 13 % (ref 11.5–15.5)
WBC: 7.7 10*3/uL (ref 4.0–10.5)
nRBC: 0 % (ref 0.0–0.2)

## 2018-08-01 LAB — HIV ANTIBODY (ROUTINE TESTING W REFLEX): HIV Screen 4th Generation wRfx: NONREACTIVE

## 2018-08-01 LAB — HEMOGLOBIN A1C
Hgb A1c MFr Bld: 11.2 % — ABNORMAL HIGH (ref 4.8–5.6)
Mean Plasma Glucose: 274.74 mg/dL

## 2018-08-01 LAB — LIPID PANEL
Cholesterol: 215 mg/dL — ABNORMAL HIGH (ref 0–200)
HDL: 25 mg/dL — ABNORMAL LOW (ref >40)
LDL Cholesterol: 159 mg/dL — ABNORMAL HIGH (ref 0–99)
Total CHOL/HDL Ratio: 8.6 RATIO
Triglycerides: 154 mg/dL — ABNORMAL HIGH (ref <150)
VLDL: 31 mg/dL (ref 0–40)

## 2018-08-01 LAB — SARS CORONAVIRUS 2 BY RT PCR (HOSPITAL ORDER, PERFORMED IN ~~LOC~~ HOSPITAL LAB): SARS Coronavirus 2: NEGATIVE

## 2018-08-01 LAB — MRSA PCR SCREENING: MRSA by PCR: NEGATIVE

## 2018-08-01 LAB — CBG MONITORING, ED
Glucose-Capillary: 393 mg/dL — ABNORMAL HIGH (ref 70–99)
Glucose-Capillary: 580 mg/dL (ref 70–99)

## 2018-08-01 MED ORDER — INSULIN ASPART 100 UNIT/ML ~~LOC~~ SOLN
0.0000 [IU] | Freq: Three times a day (TID) | SUBCUTANEOUS | Status: DC
  Administered 2018-08-01: 2 [IU] via SUBCUTANEOUS
  Administered 2018-08-02 (×2): 5 [IU] via SUBCUTANEOUS
  Administered 2018-08-02: 9 [IU] via SUBCUTANEOUS

## 2018-08-01 MED ORDER — INSULIN REGULAR(HUMAN) IN NACL 100-0.9 UT/100ML-% IV SOLN: INTRAVENOUS | Status: DC

## 2018-08-01 MED ORDER — DEXTROSE-NACL 5-0.45 % IV SOLN
INTRAVENOUS | Status: DC
  Administered 2018-08-01: 13:00:00 via INTRAVENOUS

## 2018-08-01 MED ORDER — HYDRALAZINE HCL 20 MG/ML IJ SOLN: 5.0000 mg | INTRAMUSCULAR | Status: DC | PRN

## 2018-08-01 MED ORDER — SODIUM CHLORIDE 0.9 % IV SOLN
INTRAVENOUS | Status: DC
  Administered 2018-08-01: 07:00:00 via INTRAVENOUS

## 2018-08-01 MED ORDER — SODIUM CHLORIDE 0.9 % IV BOLUS (SEPSIS)
2000.0000 mL | Freq: Once | INTRAVENOUS | Status: AC
  Administered 2018-08-01: 2000 mL via INTRAVENOUS

## 2018-08-01 MED ORDER — INSULIN REGULAR(HUMAN) IN NACL 100-0.9 UT/100ML-% IV SOLN
INTRAVENOUS | Status: DC
  Administered 2018-08-01: 3.3 [IU]/h via INTRAVENOUS
  Filled 2018-08-01: qty 100

## 2018-08-01 MED ORDER — DEXTROSE-NACL 5-0.45 % IV SOLN: INTRAVENOUS | Status: DC

## 2018-08-01 MED ORDER — INSULIN GLARGINE 100 UNIT/ML ~~LOC~~ SOLN
10.0000 [IU] | Freq: Once | SUBCUTANEOUS | Status: AC
  Administered 2018-08-01: 10 [IU] via SUBCUTANEOUS
  Filled 2018-08-01: qty 0.1

## 2018-08-01 MED ORDER — INSULIN GLARGINE 100 UNIT/ML ~~LOC~~ SOLN
10.0000 [IU] | Freq: Every day | SUBCUTANEOUS | Status: DC
  Filled 2018-08-01: qty 0.1

## 2018-08-01 MED ORDER — INSULIN STARTER KIT- PEN NEEDLES (ENGLISH)
1.0000 | Freq: Once | Status: AC
  Administered 2018-08-01: 1
  Filled 2018-08-01: qty 1

## 2018-08-01 MED ORDER — LACTATED RINGERS IV BOLUS
1000.0000 mL | Freq: Once | INTRAVENOUS | Status: AC
  Administered 2018-08-01: 1000 mL via INTRAVENOUS

## 2018-08-01 MED ORDER — AMLODIPINE BESYLATE 10 MG PO TABS
10.0000 mg | ORAL_TABLET | Freq: Every day | ORAL | Status: DC
  Administered 2018-08-01 – 2018-08-02 (×2): 10 mg via ORAL
  Filled 2018-08-01 (×2): qty 1

## 2018-08-01 MED ORDER — ALBUTEROL SULFATE (2.5 MG/3ML) 0.083% IN NEBU: 2.5000 mg | INHALATION_SOLUTION | Freq: Four times a day (QID) | RESPIRATORY_TRACT | Status: DC | PRN

## 2018-08-01 MED ORDER — INSULIN ASPART 100 UNIT/ML ~~LOC~~ SOLN
0.0000 [IU] | Freq: Every day | SUBCUTANEOUS | Status: DC
  Administered 2018-08-01: 4 [IU] via SUBCUTANEOUS

## 2018-08-01 MED ORDER — LIVING WELL WITH DIABETES BOOK
Freq: Once | Status: AC
  Administered 2018-08-01: 14:00:00
  Filled 2018-08-01: qty 1

## 2018-08-01 MED ORDER — HYDRALAZINE HCL 20 MG/ML IJ SOLN: 10.0000 mg | Freq: Four times a day (QID) | INTRAMUSCULAR | Status: DC | PRN

## 2018-08-01 MED ORDER — ENOXAPARIN SODIUM 40 MG/0.4ML ~~LOC~~ SOLN
40.0000 mg | SUBCUTANEOUS | Status: DC
  Administered 2018-08-01 – 2018-08-02 (×2): 40 mg via SUBCUTANEOUS
  Filled 2018-08-01 (×2): qty 0.4

## 2018-08-01 MED ORDER — INSULIN GLARGINE 100 UNIT/ML ~~LOC~~ SOLN: 10.0000 [IU] | Freq: Once | SUBCUTANEOUS | Status: DC

## 2018-08-01 NOTE — Progress Notes (Signed)
[facility]                                                                              Patient Demographics  Justin Liu, is a 43 y.o. male, DOB - 12/31/1975, ZOX:[id number]  Admit date - 07/31/2018   Admitting Physician John GiovanniVasundhra Rathore, MD  Outpatient Primary MD for the patient is System, Pcp Not In  Outpatient specialists:   LOS - 0  days   Medical records reviewed and are as summarized below:    Chief Complaint  Patient presents with  . Multiple Complaints       Brief summary   Patient is a 43 year old male with history of asthma, hypertension presented to ED with polydipsia and polyuria for last 2 weeks.  Patient reported drinking a lot of apple juice, mixing it with water, eating a lot of fruit recently.  For the last 2 days felt very lethargic, blurry vision.  Patient reported family history of diabetes. In ED, CBG was 608, bicarb 20, gap 18, potassium 5.3, creatinine 1.3, baseline 1.1.  UA positive for glucosuria.  Patient received liters of IV fluids and was admitted with DKA protocol on insulin infusion.   Assessment & Plan    Principal Problem:   DKA (diabetic ketoacidoses) (HCC) with diagnosis of new onset type 2 diabetes mellitus -Patient has family history of diabetes with multiple members having DM. -CBG 608 at the time of admission, creatinine 1.39, gap 18 with hyperkalemia -Continue IV fluids hydration with IV insulin drip, until gap closed close to 12, CBGs x4 less than 180, then will switch to subcu insulin -Hemoglobin A1c 11.2 -Discussed in detail with the patient regarding DKA, new onset DM, will also place diabetic coordinator consult and dietitian consult for further education.  Recommended diet and exercise.  Active Problems:   Essential hypertension -BP elevated, restart amlodipine -Hold losartan until creatinine function normalized  Mild acute kidney injury likely due to profound dehydration and DKA -Creatinine 1.39 at  the time of admission, continue IV fluid hydration, creatinine improving  Hyperlipidemia -LDL 159, cholesterol 215, triglycerides 154 -Goal LDL less than 100, will place on Lipitor 20 mg daily at the time of discharge.     Code Status: Full CODE STATUS DVT Prophylaxis:  Lovenox  Family Communication: Discussed in detail with the patient, all imaging results, lab results explained to the patient    Disposition Plan: Still on IV insulin and DKA protocol, patient very overwhelmed with new diagnosis of diabetes mellitus.  Spent extensive amount of time on education.  Hopefully DC home tomorrow once on subcu insulin.  Time Spent in minutes 45 minutes  Procedures:  None  Consultants:   None  Antimicrobials:   Anti-infectives (From admission, onward)   None          Medications  Scheduled Meds: . enoxaparin (LOVENOX) injection  40 mg Subcutaneous Q24H   Continuous Infusions: . sodium chloride 200 mL/hr at 08/01/18 0646  . insulin 9.3 Units/hr (08/01/18 1028)   PRN Meds:.albuterol, hydrALAZINE      Subjective:   Justin MedinMichael Liu was seen and examined today.  At the time of my examination,  overwhelmed with the new diagnosis of diabetes.  Patient denies dizziness, chest pain, shortness of breath, abdominal pain, N/V/D/C, new weakness, numbess, tingling. No acute events overnight.    Objective:   Vitals:   08/01/18 0545 08/01/18 0635 08/01/18 0918 08/01/18 1029  BP: (!) 148/80 (!) 154/87 (!) 172/115 (!) 154/115  Pulse:  70  (!) 56  Resp: Temp:  98.2 F (36.8 C)    TempSrc:  Oral    SpO2:  100% 100% 99%    Intake/Output Summary (Last 24 hours) at 08/01/2018 1056 Last data filed at 08/01/2018 0858 Gross per 24 hour  Intake 4065 ml  Output 1225 ml  Net 2840 ml     Wt Readings from Last 3 Encounters:  12/14/16 122.5 kg  07/06/16 126.6 kg  02/29/16 126.9 kg     Exam  General: Alert and oriented x 3, NAD  Eyes:   HEENT:  Atraumatic,  normocephalic, normal oropharynx  Cardiovascular: S1 S2 auscultated, Regular rate and rhythm.  Respiratory: Clear to auscultation bilaterally, no wheezing, rales or rhonchi  Gastrointestinal: Soft, nontender, nondistended, + bowel sounds  Ext: no pedal edema bilaterally  Neuro: No new deficits  Musculoskeletal: No digital cyanosis, clubbing  Skin: No rashes  Psych: Normal affect and demeanor, alert and oriented x3    Data Reviewed:  I have personally reviewed following labs and imaging studies  Micro Results Recent Results (from the past 240 hour(s))  SARS Coronavirus 2 (CEPHEID - Performed in [facility] hospital lab), Hosp Order     Status: None   Collection Time: 08/01/18  5:16 AM  Result Value Ref Range Status   SARS Coronavirus 2 NEGATIVE NEGATIVE Final    Comment: (NOTE) If result is NEGATIVE SARS-CoV-2 target nucleic acids are NOT DETECTED. The SARS-CoV-2 RNA is generally detectable in upper and lower  respiratory specimens during the acute phase of infection. The lowest  concentration of SARS-CoV-2 viral copies this assay can detect is 250  copies / mL. A negative result does not preclude SARS-CoV-2 infection  and should not be used as the sole basis for treatment or other  patient management decisions.  A negative result may occur with  improper specimen collection / handling, submission of specimen other  than nasopharyngeal swab, presence of viral mutation(s) within the  areas targeted by this assay, and inadequate number of viral copies  (<250 copies / mL). A negative result must be combined with clinical  observations, patient history, and epidemiological information. If result is POSITIVE SARS-CoV-2 target nucleic acids are DETECTED. The SARS-CoV-2 RNA is generally detectable in upper and lower  respiratory specimens dur ing the acute phase of infection.  Positive  results are indicative of active infection with SARS-CoV-2.  Clinical  correlation with  patient history and other diagnostic information is  necessary to determine patient infection status.  Positive results do  not rule out bacterial infection or co-infection with other viruses. If result is PRESUMPTIVE POSTIVE SARS-CoV-2 nucleic acids MAY BE PRESENT.   A presumptive positive result was obtained on the submitted specimen  and confirmed on repeat testing.  While 2019 novel coronavirus  (SARS-CoV-2) nucleic acids may be present in the submitted sample  additional confirmatory testing may be necessary for epidemiological  and / or clinical management purposes  to differentiate between  SARS-CoV-2 and other Sarbecovirus currently known to infect humans.  If clinically indicated additional testing with an alternate test  methodology [phone]) is advised. The SARS-CoV-2 RNA  is generally  detectable in upper and lower respiratory sp ecimens during the acute  phase of infection. The expected result is Negative. Fact Sheet for Patients:  BoilerBrush.com.cy Fact Sheet for Healthcare Providers: https://pope.com/ This test is not yet approved or cleared by the [place] FDA and has been authorized for detection and/or diagnosis of SARS-CoV-2 by FDA under an Emergency Use Authorization (EUA).  This EUA will remain in effect (meaning this test can be used) for the duration of the COVID-19 declaration under Section 564(b)(1) of the Act, 21 U.S.C. section 360bbb-3(b)(1), unless the authorization is terminated or revoked sooner. Performed at [facility], [address] [place], [place] [address]   MRSA PCR Screening     Status: None   Collection Time: 08/01/18  7:00 AM  Result Value Ref Range Status   MRSA by PCR NEGATIVE NEGATIVE Final    Comment:        The GeneXpert MRSA Assay (FDA approved for NASAL specimens only), is one component of a comprehensive MRSA colonization surveillance program. It is not intended to  diagnose MRSA infection nor to guide or monitor treatment for MRSA infections. Performed at [facility], [address], [place], [place] [address]     Radiology Reports No results found.  Lab Data:  CBC: Recent Labs  Lab 08/01/18 0045  WBC 7.7  NEUTROABS 4.5  HGB 15.3  HCT 44.8  MCV 91.2  PLT 280   Basic Metabolic Panel: Recent Labs  Lab 08/01/18 0045 08/01/18 0330  NA 134* 139  K 5.3* 5.5*  CL 96* 103  CO2 20* 20*  GLUCOSE 608* 442*  BUN 18 15  CREATININE 1.39* 1.23  CALCIUM 10.1 9.4   GFR: CrCl cannot be calculated (Unknown ideal weight.). Liver Function Tests: No results for input(s): AST, ALT, ALKPHOS, BILITOT, PROT, ALBUMIN in the last 168 hours. No results for input(s): LIPASE, AMYLASE in the last 168 hours. No results for input(s): AMMONIA in the last 168 hours. Coagulation Profile: No results for input(s): INR, PROTIME in the last 168 hours. Cardiac Enzymes: No results for input(s): CKTOTAL, CKMB, CKMBINDEX, TROPONINI in the last 168 hours. BNP (last 3 results) No results for input(s): PROBNP in the last 8760 hours. HbA1C: Recent Labs    08/01/18 0602  HGBA1C 11.2*   CBG: Recent Labs  Lab 08/01/18 0026 08/01/18 0507 08/01/18 0632 08/01/18 0748 08/01/18 0855  GLUCAP 580* 393* 331* 301* 309*   Lipid Profile: Recent Labs    08/01/18 0045  CHOL 215*  HDL 25*  LDLCALC 159*  TRIG 154*  CHOLHDL 8.6   Thyroid Function Tests: No results for input(s): TSH, T4TOTAL, FREET4, T3FREE, THYROIDAB in the last 72 hours. Anemia Panel: No results for input(s): VITAMINB12, FOLATE, FERRITIN, TIBC, IRON, RETICCTPCT in the last 72 hours. Urine analysis:    Component Value Date/Time   COLORURINE STRAW (A) 08/01/2018 0516   APPEARANCEUR CLEAR 08/01/2018 0516   LABSPEC 1.030 08/01/2018 0516   PHURINE 5.0 08/01/2018 0516   GLUCOSEU >=500 (A) 08/01/2018 0516   HGBUR NEGATIVE 08/01/2018 0516   BILIRUBINUR NEGATIVE 08/01/2018 0516   KETONESUR  80 (A) 08/01/2018 0516   PROTEINUR NEGATIVE 08/01/2018 0516   NITRITE NEGATIVE 08/01/2018 0516   LEUKOCYTESUR NEGATIVE 08/01/2018 0516       M.D. [facility] 08/01/2018, 10:56 AM  Pager: [phone] Between 7am to 7pm - call Pager - [phone]  After 7pm go to [email or web address] - password [email or web address]  Call night coverage person covering  after 7pm

## 2018-08-01 NOTE — H&P (Addendum)
History and Physical    Gaye AlkenMichael Rennard Timoteo ExposeWelch Jr. [id number] DOB: [birth year] DOA: 07/31/2018  PCP: System, Pcp Not In Patient coming from: Home  Chief Complaint: Polydipsia, polyuria  HPI: Justin DowdyMichael Rennard Leer Jr. is a 43 y.o. male with medical history significant of asthma, hypertension presenting to the hospital for evaluation of polydipsia and polyuria.  Patient reports 2-week history of polydipsia and polyuria.  States he likes drinking a lot of apple juice and has been mixing it with his water.  He has also been eating a lot of fruit recently.  For the past 2 days he has been feeling very lethargic.  States while sitting at the waiting room today he noticed that his vision was a little blurry but is now back to normal.  States he has been diagnosed with prediabetes and has a family history of diabetes.  He has been feeling nauseous but has not vomited.  Denies any abdominal pain or dysuria.  Denies any fevers, chills, chest pain, or shortness of breath.  ED Course: Vitals stable on arrival.  CBC unremarkable.  Blood glucose 608.  Bicarb 20, anion gap 18.  Potassium 5.3.  Creatinine 1.3, baseline 1.1.  UA pending.  Patient received 3 L IV fluid boluses in the ED and was started on insulin infusion.  Review of Systems:  All systems reviewed and apart from history of presenting illness, are negative.  Past Medical History:  Diagnosis Date  . Asthma   . Hypertension   . Irregular heartbeat     History reviewed. No pertinent surgical history.   reports that he quit smoking about 10 years ago. He quit after 10.00 years of use. He has never used smokeless tobacco. He reports that he does not drink alcohol or use drugs.  Allergies  Allergen Reactions  . Watermelon [Citrullus Vulgaris] Anaphylaxis  . Lisinopril Hives    Swollen gums    Family History  Problem Relation Age of Onset  . Hypertension Mother   . Stroke Mother   . Hypertension Father   . Diabetes Father   . Heart  disease Father   . Heart attack Paternal Uncle   . Heart attack Paternal Grandmother   . Heart disease Paternal Grandmother   . Heart attack Paternal Grandfather   . Heart disease Paternal Grandfather     Prior to Admission medications   Medication Sig Start Date End Date Taking? Authorizing Provider  acetaminophen (TYLENOL) 500 MG tablet Take 1 tablet (500 mg total) by mouth every 8 (eight) hours as needed (pain). 12/14/16   Caccavale, Sophia, PA-C  albuterol (PROVENTIL HFA;VENTOLIN HFA) 108 (90 Base) MCG/ACT inhaler Inhale 2 puffs into the lungs every 4 (four) hours as needed for wheezing or shortness of breath. 07/06/16   Hoy RegisterNewlin, Enobong, MD  amLODipine (NORVASC) 10 MG tablet Take 1 tablet (10 mg total) by mouth daily. 07/06/16   Hoy RegisterNewlin, Enobong, MD  aspirin EC 81 MG tablet Take 81 mg by mouth daily.    [provider]  cetirizine (ZYRTEC) 10 MG tablet Take 1 tablet (10 mg total) by mouth daily. 07/06/16   Hoy RegisterNewlin, Enobong, MD  colchicine 0.6 MG tablet Take 1 tablet (0.6 mg total) by mouth 2 (two) times daily. 01/02/17 01/09/17  Kellie ShropshireShrosbree, Emily J, PA-C  fluticasone (FLONASE) 50 MCG/ACT nasal spray Place 2 sprays into both nostrils daily. 07/06/16   Hoy RegisterNewlin, Enobong, MD  losartan (COZAAR) 100 MG tablet Take 1 tablet (100 mg total) by mouth daily. 07/06/16   Newlin, Odette HornsEnobong,  MD    Physical Exam: Vitals:   07/31/18 1858 08/01/18 0030 08/01/18 0523 08/01/18 0545  BP: 102/60 (!) 154/81 (!) 146/100 (!) 148/80  Pulse: 98 80 61   Resp: Temp: 98 F (36.7 C)  98.6 F (37 C)   TempSrc: Oral  Oral   SpO2: 98% 100% 100%     Physical Exam  Constitutional: He is oriented to person, place, and time. He appears well-developed and well-nourished. No distress.  HENT:  Head: Normocephalic.  Dry mucous membranes  Eyes: Right eye exhibits no discharge. Left eye exhibits no discharge.  Neck: Neck supple.  Cardiovascular: Normal rate, regular rhythm and intact distal pulses.   Pulmonary/Chest: Effort normal and breath sounds normal. No respiratory distress. He has no wheezes. He has no rales.  Abdominal: Soft. Bowel sounds are normal. He exhibits no distension. There is no abdominal tenderness. There is no guarding.  Musculoskeletal:        General: No edema.  Neurological: He is alert and oriented to person, place, and time.  Skin: Skin is warm and dry. He is not diaphoretic.  Psychiatric: He has a normal mood and affect. His behavior is normal.     Labs on Admission: I have personally reviewed following labs and imaging studies  CBC: Recent Labs  Lab 08/01/18 0045  WBC 7.7  NEUTROABS 4.5  HGB 15.3  HCT 44.8  MCV 91.2  PLT 280   Basic Metabolic Panel: Recent Labs  Lab 08/01/18 0045 08/01/18 0330  NA 134* 139  K 5.3* 5.5*  CL 96* 103  CO2 20* 20*  GLUCOSE 608* 442*  BUN 18 15  CREATININE 1.39* 1.23  CALCIUM 10.1 9.4   GFR: CrCl cannot be calculated (Unknown ideal weight.). Liver Function Tests: No results for input(s): AST, ALT, ALKPHOS, BILITOT, PROT, ALBUMIN in the last 168 hours. No results for input(s): LIPASE, AMYLASE in the last 168 hours. No results for input(s): AMMONIA in the last 168 hours. Coagulation Profile: No results for input(s): INR, PROTIME in the last 168 hours. Cardiac Enzymes: No results for input(s): CKTOTAL, CKMB, CKMBINDEX, TROPONINI in the last 168 hours. BNP (last 3 results) No results for input(s): PROBNP in the last 8760 hours. HbA1C: No results for input(s): HGBA1C in the last 72 hours. CBG: Recent Labs  Lab 08/01/18 0026 08/01/18 0507  GLUCAP 580* 393*   Lipid Profile: No results for input(s): CHOL, HDL, LDLCALC, TRIG, CHOLHDL, LDLDIRECT in the last 72 hours. Thyroid Function Tests: No results for input(s): TSH, T4TOTAL, FREET4, T3FREE, THYROIDAB in the last 72 hours. Anemia Panel: No results for input(s): VITAMINB12, FOLATE, FERRITIN, TIBC, IRON, RETICCTPCT in the last 72 hours. Urine analysis:     Component Value Date/Time   COLORURINE STRAW (A) 08/01/2018 0516   APPEARANCEUR CLEAR 08/01/2018 0516   LABSPEC 1.030 08/01/2018 0516   PHURINE 5.0 08/01/2018 0516   GLUCOSEU >=500 (A) 08/01/2018 0516   HGBUR NEGATIVE 08/01/2018 0516   BILIRUBINUR NEGATIVE 08/01/2018 0516   KETONESUR 80 (A) 08/01/2018 0516   PROTEINUR NEGATIVE 08/01/2018 0516   NITRITE NEGATIVE 08/01/2018 0516   LEUKOCYTESUR NEGATIVE 08/01/2018 0516    Radiological Exams on Admission: No results found.  EKG: Independently reviewed.  Sinus rhythm, T wave inversions in lateral leads.  No significant change since tracing from June 29, 2018.  Assessment/Plan Principal Problem:   DKA (diabetic ketoacidoses) (HCC) Active Problems:   Essential hypertension   Asthma   New onset type 2 diabetes mellitus (  HCC)   Dehydration   New onset diabetes, mild DKA Patient has a history of prediabetes and endorses increased dietary carbohydrate intake.  Hemodynamically stable.  AAO x4.  Blood glucose 608.  Bicarb 20, anion gap 18.  Infection less likely to be a precipitating factor as patient is afebrile and does not have leukocytosis.  No infectious signs or symptoms. -Admit to stepdown unit -Insulin infusion per DKA protocol -Received 3 L IV fluid boluses in the ED.  Continue normal saline at 200 cc/h until CBG less than 250.  -Repeat labs showing bicarb 20 and anion gap 16.  Most recent CBG 393.  Continue insulin infusion until CBG less than 250.  Then, subcutaneous insulin and diet can be initiated.  Continue IV insulin for another 2 hours after initiation of subcutaneous insulin. -Check A1c  Dehydration -Creatinine 1.3, baseline 1.1.   -Continue IV fluid hydration  Asthma -Stable.  No bronchospasm.  Albuterol PRN.  Hypertension  Systolic currently in 140s. -Hydralazine PRN  Unable to safely order home medications at this time as pharmacy medication reconciliation is pending.  DVT prophylaxis: Lovenox Code  Status: Full code Family Communication: No family available. Disposition Plan: Anticipate discharge later today. Consults called: None Admission status: It is my clinical opinion that referral for OBSERVATION is reasonable and necessary in this patient based on the above information provided. The aforementioned taken together are felt to place the patient at high risk for further clinical deterioration. However it is anticipated that the patient may be medically stable for discharge from the hospital within 24 to 48 hours.  The medical decision making on this patient was of high complexity and the patient is at high risk for clinical deterioration, therefore this is a level 3 visit.  John Giovanni MD [facility] Pager [phone]  If 7PM-7AM, please contact night-coverage [email or web address] Password [email or web address]  08/01/2018, 6:10 AM

## 2018-08-01 NOTE — Progress Notes (Signed)
Patient arrived to the unit via bed from the emergency department.  Patient is alert and oriented x 4. No complaints of pain.  Skin assessment complete.  No skin issues.  IV intact to the right forearm.  Placed the patient on telemetry.  Educated the patient on how to reach the staff on the unit.  Lowered the bed and placed the call light within reach. Will continue to monitor the patient and notify as needed.

## 2018-08-01 NOTE — Discharge Instructions (Addendum)
Carbohydrate Counting For People With Diabetes  Why Is Carbohydrate Counting Important?  Counting carbohydrate servings may help you control your blood glucose level so that you feel better.   The balance between the carbohydrates you eat and insulin determines what your blood glucose level will be after eating.   Carbohydrate counting can also help you plan your meals. Which Foods Have Carbohydrates? Foods with carbohydrates include:  Breads, crackers, and cereals   Pasta, rice, and grains   Starchy vegetables, such as potatoes, corn, and peas   Beans and legumes   Milk, soy milk, and yogurt   Fruits and fruit juices   Sweets, such as cakes, cookies, ice cream, jam, and jelly Carbohydrate Servings In diabetes meal planning, 1 serving of a food with carbohydrate has about 15 grams of carbohydrate:  Check serving sizes with measuring cups and spoons or a food scale.   Read the Nutrition Facts on food labels to find out how many grams of carbohydrate are in foods you eat. The food lists in this handout show portions that have about 15 grams of carbohydrate.  Tips Meal Planning Tips  An Eating Plan tells you how many carbohydrate servings to eat at your meals and snacks. For many adults, eating 3 to 5 servings of carbohydrate foods at each meal and 1 or 2 carbohydrate servings for each snack works well.   In a healthy daily Eating Plan, most carbohydrates come from:   At least 6 servings of fruits and nonstarchy vegetables   At least 6 servings of grains, beans, and starchy vegetables, with at least 3 servings from whole grains   At least 2 servings of milk or milk products  Check your blood glucose level regularly. It can tell you if you need to adjust when you eat carbohydrates.   Eating foods that have fiber, such as whole grains, and having very few salty foods is good for your health.   Eat 4 to 6 ounces of meat or other protein foods (such as soybean burgers) each  day. Choose low-fat sources of protein, such as lean beef, lean pork, chicken, fish, low-fat cheese, or vegetarian foods such as soy.   Eat some healthy fats, such as olive oil, canola oil, and nuts.   Eat very little saturated fats. These unhealthy fats are found in butter, cream, and high-fat meats, such as bacon and sausage.   Eat very little or no trans fats. These unhealthy fats are found in all foods that list partially hydrogenated oil as an ingredient.  Label Reading Tips The Nutrition Facts panel on a label lists the grams of total carbohydrate in 1 standard serving. The label's standard serving may be larger or smaller than 1 carbohydrate serving. To figure out how many carbohydrate servings are in the food:  First, look at the label's standard serving size.   Check the grams of total carbohydrate. This is the amount of carbohydrate in 1 standard serving.   Divide the grams of total carbohydrate by 15. This number equals the number of carbohydrate servings in 1 standard serving. Remember: 1 carbohydrate serving is 15 grams of carbohydrate.   Note: You may ignore the grams of sugars on the Nutrition Facts panel because they are included in the grams of total carbohydrate.  Foods Recommended 1 serving = about 15 grams of carbohydrate Starches  1 slice bread (1 ounce)   1 tortilla (6-inch size)    large bagel (1 ounce)   2 taco shells (5-inch  size)  °• ½ hamburger or hot dog bun (¾ ounce)  °• ¾ cup ready-to-eat unsweetened cereal  °• ½ cup cooked cereal  °• 1 cup broth-based soup  °• 4 to 6 small crackers  °• 1/3 cup pasta or rice (cooked)  °• ½ cup beans, peas, corn, sweet potatoes, winter squash, or mashed or boiled potatoes (cooked)  °• ¼ large baked potato (3 ounces)  °• ¾ ounce pretzels, potato chips, or tortilla chips  °• 3 cups popcorn (popped) °Fruit °• 1 small fresh fruit (¾ to 1 cup)  °• ½ cup canned or frozen fruit  °• 2 tablespoons dried fruit (blueberries,  cherries, cranberries, mixed fruit, raisins)  °• 17 small grapes (3 ounces)  °• 1 cup melon or berries  °• ½ cup unsweetened fruit juice °Milk °• 1 cup fat-free or reduced-fat milk  °• 1 cup soy milk  °• 2/3 cup (6 ounces) nonfat yogurt sweetened with sugar-free sweetener °Sweets and Desserts °• 2-inch square cake (unfrosted)  °• 2 small cookies (2/3 ounce)  °• ½ cup ice cream or frozen yogurt  °• ¼ cup sherbet or sorbet  °• 1 tablespoon syrup, jam, jelly, table sugar, or honey  °• 2 tablespoons light syrup °Other Foods °• Count 1 cup raw vegetables or ½ cup cooked nonstarchy vegetables as zero (0) carbohydrate servings or “free” foods. If you eat 3 or more servings at one meal, count them as 1 carbohydrate serving.  °• Foods that have less than 20 calories in each serving also may be counted as zero carbohydrate servings or “free” foods.  °• Count 1 cup of casserole or other mixed foods as 2 carbohydrate servings. ° °Carbohydrate Counting for People with Diabetes Sample 1-Day Menu  °Breakfast 1 extra-small banana (1 carbohydrate serving)  °1 cup low-fat or fat-free milk (1 carbohydrate serving)  °1 slice whole wheat bread (1 carbohydrate serving)  °1 teaspoon margarine  °Lunch 2 ounces turkey slices  °2 slices whole wheat bread (2 carbohydrate servings)  °2 lettuce leaves  °4 celery sticks  °4 carrot sticks  °1 medium apple (1 carbohydrate serving)  °1 cup low-fat or fat-free milk (1 carbohydrate serving)  °Afternoon Snack 2 tablespoons raisins (1 carbohydrate serving)  °3/4 ounce unsalted mini pretzels (1 carbohydrate serving)  °Evening Meal 3 ounces lean roast beef  °1/2 large baked potato (2 carbohydrate servings)  °1 tablespoon reduced-fat sour cream  °1/2 cup green beans  °1 tablespoon light salad dressing  °1 whole wheat dinner roll (1 carbohydrate serving)  °1 teaspoon margarine  °1 cup melon balls (1 carbohydrate serving)  °Evening Snack 2 tablespoons unsalted nuts  ° °Carbohydrate Counting for People with  Diabetes Vegan Sample 1-Day Menu  °Breakfast 1 cup cooked oatmeal (2 carbohydrate servings)  °½ cup blueberries (1 carbohydrate serving)  °2 tablespoons flaxseeds  °1 cup soymilk fortified with calcium and vitamin D  °1 cup coffee  °Lunch 2 slices whole wheat bread (2 carbohydrate servings)  °½ cup baked tofu  °¼ cup lettuce  °2 slices tomato  °2 slices avocado  °½ cup baby carrots  °1 orange (1 carbohydrate serving)  °1 cup soymilk fortified with calcium and vitamin D   °Evening Meal Burrito made with: 1 6-inch corn tortilla (1 carbohydrate serving)  °1 cup refried vegetarian beans (1 carbohydrate serving)  °¼ cup chopped tomatoes  °¼ cup lettuce  °¼ cup salsa  °1/3 cup brown rice (1 carbohydrate serving)  °1 tablespoon olive oil for rice  °½   cup zucchini   Evening Snack 6 small whole grain crackers (1 carbohydrate serving)  2 apricots ( carbohydrate serving)   cup unsalted peanuts ( carbohydrate serving)     Carbohydrate Counting for People with Diabetes Vegetarian (Lacto-Ovo) Sample 1-Day Menu  Breakfast 1 cup cooked oatmeal (2 carbohydrate servings)   cup blueberries (1 carbohydrate serving)  2 tablespoons flaxseeds  1 egg  1 cup 1% milk (1 carbohydrate serving)  1 cup coffee  Lunch 2 slices whole wheat bread (2 carbohydrate servings)  2 ounces low-fat cheese   cup lettuce  2 slices tomato  2 slices avocado   cup baby carrots  1 orange (1 carbohydrate serving)  1 cup unsweetened tea  Evening Meal Burrito made with: 1 6-inch corn tortilla (1 carbohydrate serving)   cup refried vegetarian beans (1 carbohydrate serving)   cup tomatoes   cup lettuce   cup salsa  1/3 cup brown rice (1 carbohydrate serving)  1 tablespoon olive oil for rice   cup zucchini  1 cup 1% milk (1 carbohydrate serving)  Evening Snack 6 small whole grain crackers (1 carbohydrate serving)  2 apricots ( carbohydrate serving)   cup unsalted peanuts ( carbohydrate serving)    Copyright 2020  [employer]y  [employer]. All rights reserved.  Using Nutrition Labels: Carbohydrate   Serving Size   Look at the serving size. All the information on the label is based on this portion.  Servings Per Container   The number of servings contained in the package.  Guidelines for Carbohydrate   Look at the total grams of carbohydrate in the serving size.   1 carbohydrate choice = 15 grams of carbohydrate. Range of Carbohydrate Grams Per Choice  Carbohydrate Grams/Choice Carbohydrate Choices  6-10   11-20 1  21-25 1  26-35 2  36-40 2  41-50 3  51-55 3  56-65 4  66-70 4  71-80 5    Copyright 2020  [employer]y [employer]. All rights reserved.      Insulin Injection Instructions, Single Insulin Dose, Adult A subcutaneous injection is a shot of medicine that is injected into the layer of fat and tissue between skin and muscle. People with type 1 diabetes must take insulin because their bodies do not make it. People with type 2 diabetes may need to take insulin.  There are many different types of insulin. The type of insulin that you take may determine how many injections you give yourself and when you need to give the injections. Supplies needed:  Soap and water to wash hands.  A new, unused insulin syringe.  Your insulin medication bottle (vial).  Alcohol wipes.  A disposal container that is meant for sharp items (sharps container), such as an empty plastic bottle with a cover. How to choose a site for injection  The body absorbs insulin differently, depending on where the insulin is injected (injection site). It is best to inject insulin into the same body area each time (for example, always in the abdomen), but you should use a different spot in that area for each injection. Do not inject the insulin in the same spot each time. There are five main areas that can be used for injecting. These areas include:  Abdomen. This is the preferred  area.  Front of thigh.  Upper, outer side of thigh.  Upper, outer side of arm.  Upper, outer part of buttock. How to give a single-dose insulin injection First, follow the steps  for Get ready, then continue with the steps for Push air into the vial, then follow the steps for Fill the syringe, and finish with the steps for Inject the insulin. Get ready 1. Wash your hands with soap and water. If soap and water are not available, use hand sanitizer. 2. Before you give yourself an insulin injection, be sure to test your blood sugar level (blood glucose level) and write down that number. Follow any instructions from your health care provider about what to do if your blood glucose level is higher or lower than your normal range. 3. Use a new, unused insulin syringe each time you need to inject insulin. 4. Check to make sure you have the correct type of insulin syringe for the concentration of insulin that you are using. 5. Check the expiration date and the type of insulin that you are using. 6. If you are using CLEAR insulin, check to see that it is clear and free of clumps. 7. Do not shake the vial to get it ready. Gently roll the vial between your palms several times. 8. Remove the plastic pop-top covering from the vial of insulin. This type of covering is present on a vial when it is new. 9. Use an alcohol wipe to clean the rubber top of the vial. 10. Remove the plastic cover from the syringe needle. Do not let the needle touch anything. Push air into the vial 1. To bring (draw up) air into the syringe, slowly pull back on the syringe plunger. Stop pulling the plunger when the dose indicator gets to the number of units that you will be using. 2. While you keep the vial right-side-up, poke the needle through the rubber top of the vial. Do not turn the vial upside down to do this. 3. Push the plunger all the way into the syringe. Doing that will push air into the vial. 4. Do not take the needle  out of the vial yet. Fill the syringe   1. While the needle is still in the vial, turn the vial upside down and hold it at eye level. 2. Slowly pull back on the plunger. Stop pulling the plunger when the dose indicator gets to the desired number of units. 3. If you see air bubbles in the syringe, slowly move the plunger up and down 2 or 3 times to make them go away. ? If you had to move the plunger to get rid of air bubbles, pull back the plunger until the dose indicator returns to the correct dose. 4. Remove the needle from the vial. Do not let the needle touch anything. Inject the insulin   1. Use an alcohol wipe to clean the site where you will be injecting the needle. Let the site air-dry. 2. Hold the syringe in your writing hand like a pencil. 3. Use your other hand to pinch and hold about an inch (2.5 cm) of skin. Do not directly touch the cleaned part of the skin. 4. Gently but quickly, put the needle straight into the skin. The needle should be at a 90-degree angle (perpendicular) to the skin. 5. Push the needle in as far as it will go (to the hub). 6. When the needle is completely inserted into the skin, use your thumb or index finger of your writing hand to push the plunger all the way into the syringe to inject the insulin. 7. Let go of the skin that you are pinching. Continue to hold the syringe in place  with your writing hand. 8. Wait 10 seconds, then pull the needle straight out of the skin. This will allow all of the insulin to go from the syringe and needle into your body. 9. Press and hold the alcohol wipe over the injection site until any bleeding stops. Do not rub the area. 10. Do not put the plastic cover back on the needle. 11. Discard the syringe and needle directly into a sharps container, such as an empty plastic bottle with a cover. How to throw away supplies  Discard all used needles in a puncture-proof sharps disposal container. You can ask your local pharmacy  about where you can get this kind of disposal container, or you can use an empty plastic liquid laundry detergent bottle that has a cover.  Follow the disposal regulations for the area where you live. Do not use any syringe or needle more than one time.  Throw away empty vials in the regular trash.   Questions to ask your health care provider  How often should I be taking insulin? 4 Times a day One shot of Lantus a long acting insulin that lasts for 24 hours, the other 3 shots are short acting insulin called Novolog/Humalog and last 3-4 hours around the meal times. We use short acting insulin to correct the glucose to come down into a normal range in addition to cover the carbohydrates we eat eat at the meal times.   How often should I check my blood glucose? 4 times a day   What amount of insulin should I be taking at each time? Written down in your list of medications   What are the side effects? Hypoglycemia also called low blood sugar. When you check your glucose this is less than 70 and you may feel foggy headed, sweaty and shaky.   What should I do if my blood glucose is too high? Make sure you have had your insulin for that day. If greater than 200 all the time call PCP office. If glucose is extremely high call PCP and they may advise you to go to the ED.   What should I do if my blood glucose is too low? This is a blood sugar less than 70. You may feel foggy headed sweaty and shaky. Eat a fast sugar (not peanut butter or chocolate, (the fat and oil content of these processes slower) get glucose/sugar tablets from [facility] (1 tube for $1) eat 4 OR drink 4oz of regular juice OR soda. Wait 15 minutes recheck your sugar if you can. If not take 4 more glucose tablets, OR drink 4 oz of regular juice OR regular soda. If you are having to repeat this several times call 911.   What should I do if I forget to take my insulin? For your long acting insulin if its within 4 hours of  missing the dose still take it, however if it has been longer since you remembered to take the long acting insulin take it at the next scheduled dose and set a reminder or alarm to remember the dose time.  For your short acting insulin, wait until before the next meal to take it.   What number should I call if I have questions? Your primary care doctor's office or if an emergency 911.  Where to find more information  American Diabetes Association (ADA): www.diabetes.org  American Association of Diabetes Educators (AADE) Patient Resources: https://www.diabeteseducator.org Summary  A subcutaneous injection is a shot of medicine that is injected into  the layer of fat and tissue between skin and muscle.  Before you give yourself an insulin injection, be sure to test your blood sugar level (blood glucose level) and write down that number.  Check the expiration date and the type of insulin that is in the pen. The type of insulin that you take may determine how many injections you give yourself and when you need to give the injections.  It is best to inject insulin into the same body area each time (for example, always in the abdomen), but you should use a different spot in that area for each injection. This information is not intended to replace advice given to you by your health care provider. Make sure you discuss any questions you have with your health care provider. Document Released: 04/01/2015 Document Revised: 03/18/2017 Document Reviewed: 04/01/2015 Elsevier Interactive Patient Education  2019 [employer]     Insulin Treatment for Diabetes Mellitus Diabetes (diabetes mellitus) is a long-term (chronic) disease. It occurs when the body does not properly use sugar (glucose) that is released from food after digestion. Glucose levels are controlled by a hormone called insulin. Insulin is made in the pancreas, which is an organ behind the stomach.  If you have type 1 diabetes, you must  take insulin because your pancreas does not make any.  If you have type 2 diabetes, you might need to take insulin along with other medicines. In type 2 diabetes, one or both of these problems may be present: ? The pancreas does not make enough insulin. ? Cells in the body do not respond properly to insulin that the body makes (insulin resistance). You must use insulin correctly to control your diabetes. You must have some insulin in your body at all times. Insulin treatment varies depending on your type of diabetes, your treatment goals, and your medical history. Ask questions to understand your insulin treatment plan so you can be an active partner in managing your diabetes. How is insulin given? Insulin can only be given through a shot (injection). It is injected using a syringe and needle, an insulin pen, a pump, or a jet injector. Your health care provider will:  Prescribe the type and amount of insulin that you need.  Tell you when you should inject your insulin. Where on the body should insulin be injected?  Insulin is injected into a layer of fatty tissue under the skin. Good places to inject insulin include:  Abdomen. Generally, the abdomen is the best place to inject insulin. However, you should avoid any area that is less than 2 inches (5 cm) from the belly button (navel).  Front of thigh.  Upper, outer side of thigh.  Upper, outer side of arm.  Upper, outer part of buttock. It is important to:  Give your injection in a slightly different place each time. This helps to prevent irritation and improve absorption.  Avoid injecting into areas that have scar tissue. Usually, you will give yourself insulin injections. Others can also be taught how to give you injections. You will use a special type of syringe that is made only for insulin. Some people may have an insulin pump that delivers insulin steadily through a tube (cannula) that is placed under the skin. What are the  different types of insulin? The following information is a general guide to different types of insulin. Specifics vary depending on the insulin product that your health care provider prescribes.   Short-acting insulin: ? Starts working in about 30 minutes. ?  Can last for 6-10 hours. ? Should be taken about 30 minutes before you start eating a meal.   Long-acting insulin: ? Mimics the small amount of insulin that your pancreas usually produces throughout the day. ? Should be used one or two times a day. ? Is usually used in combination with other types of insulin or other medicines.  What are the side effects of insulin? Possible side effects of insulin treatment include:  Low blood glucose (hypoglycemia).  Weight gain.  High blood glucose (hyperglycemia).  Skin injury or irritation. Some of these side effects can be caused by using improper injection technique. Be sure to learn how to inject insulin properly. These are the Insulins you will be on at time of discharge.   Long acting/Basal insulin (Lantus or Levemir). This is the long acting insulin that lasts 24 hours. When glucose is high all the time this dose brings the overall trend down a good notch.  ? Usually, used one time a day to manage insulin levels.    The Meal Coverage insulin and Correction insulin are the same insulin. Doses are combined in the SAME shot. Correction scale (depending on what you sugar is at the time) IS ADDED to the Meal coverage (which is a set dose to cover with the meals).   Meal Coverage insulin (Novolog or Humalog).  ? Blood glucose rises quickly after a meal.  Short-acting insulin can be used right before a meal to quickly lower your blood glucose. ? You may be instructed to adjust the amount of prandial insulin that you take, based on how much carbohydrate (starch) is in your meal.  Corrective insulin (Novolog or Humalog). This may also be called a correction dose. This is a small amount  of short-acting insulin used to lower your blood glucose if it is too high. You may be instructed to check your blood glucose at certain times of the day and use corrective insulin as needed.   Tight control, or intensive therapy. This means keeping your blood glucose as close to your target as possible, and preventing your blood glucose from getting too high after meals. People who have tight control of their diabetes have fewer long-term problems caused by diabetes. Follow these instructions at home:  Talk with your health care provider or pharmacist about the type of insulin you should take and when you should take it. You should know when your insulin goes up the most (peaks) and when it wears off. You need this information so you can plan your meals and exercise. Work with your health care provider to:  Check your blood glucose every day. Your health care provider will tell you how often and when you should do this.  Manage your: ? Weight. ? Blood pressure. ? Cholesterol. ? Stress.  Eat a healthy diet.  Exercise regularly. Summary  Diabetes is a long-term (chronic) disease. It occurs when the body does not properly use sugar (glucose) that is released from food after digestion. Glucose levels are controlled by a hormone called insulin, which is made in an organ behind your stomach (pancreas).  You must use insulin correctly to control your diabetes. You must have some insulin in your body at all times. This information is not intended to replace advice given to you by your health care provider. Make sure you discuss any questions you have with your health care provider. Document Released: 05/25/2008 Document Revised: 09/19/2016 Document Reviewed: 04/01/2015 Elsevier Interactive Patient Education  2019 [employer].

## 2018-08-01 NOTE — Progress Notes (Addendum)
Inpatient Diabetes Program Recommendations  AACE/ADA: New Consensus Statement on Inpatient Glycemic Control (2015)  Target Ranges:  Prepandial:   less than 140 mg/dL      Peak postprandial:   less than 180 mg/dL (1-2 hours)      Critically ill patients:  140 - 180 mg/dL   Lab Results  Component Value Date   GLUCAP 152 (H) 08/01/2018   HGBA1C 11.2 (H) 08/01/2018    Review of Glycemic Control  Diabetes history: DM2 Outpatient Diabetes medications: none  Current orders for Inpatient glycemic control: Novolog (0-9 units) correction tid ac and (0-5) hs.  Inpatient Diabetes Program Recommendations:    Patient was on insulin drip (last rate 4.5 units/hour documented) stopped at 1745 today. Lantus 10 units one time order given at 1751. Expect patient will need more basal insulin.   MD please consider the following inpatient insulin adjustment:  Lantus 20 units QHS (start tonight). Based on 0.2 units/kg.    Going to speak to patient shortly regarding diabetes management / going home with insulin. Bedside RN Jacqulyn Bath has given patient Living well with diabetes booklet and Insulin pen starter kit and started diabetes education.   -- Will follow during hospitalization.--  Jonna Clark RN, MSN Diabetes Coordinator Inpatient Glycemic Control Team Team Pager: [phone] (8am-5pm)

## 2018-08-01 NOTE — Progress Notes (Signed)
Inpatient Diabetes Program Recommendations      AACE/ADA: New Consensus Statement on Inpatient Glycemic Control (2015)  Target Ranges:  Prepandial:   less than 140 mg/dL      Peak postprandial:   less than 180 mg/dL (1-2 hours)      Critically ill patients:  140 - 180 mg/dL   Lab Results  Component Value Date   GLUCAP 194 (H) 08/01/2018   HGBA1C 11.2 (H) 08/01/2018    Results for [facility] (MRN [id number]) as of 08/01/2018 12:17  Ref. Range 08/01/2018 06:32 08/01/2018 07:48 08/01/2018 08:55 08/01/2018 10:26 08/01/2018 11:44  Glucose-Capillary Latest Ref Range: 70 - 99 mg/dL 331 (H) 301 (H) 309 (H) 246 (H) 194 (H)     Review of Glycemic Control  Diabetes history: New diagnosis DM2 (admit with DKA)  Outpatient Diabetes medications: none  Current orders for Inpatient glycemic control: insulin drip currently at 8 units/hour   Patient admitted with DKA with a serum glucose '608mg'$ /dl. Admit labs at midnight were CO2 = 20/ AG 18; Last labs 0330 Co2=20/AG 16.   Diabetic consult noted for DKA/new DM2 diagnosis. Have ordered Living Well with Diabetes book and insulin pen starter kit for patient. Have spoken to [facility] (Physiological scientist) about showing patient how to check CBG/inject insulin and having patient practice with supervision.  Will see patient today and show him how to inject insulin and ask his preference vial/syringe verses insulin pen. Per chart patient does not appear to have insurance so most likely insulin would need to be affordable such as 70/30.   Did see in computer a note 09/26/17 from Daisy Lazar PA at [place] (annual physical exam). He did have DM2 as a diagnosis from that visit but had no diabetic meds ordered. Hgb A1c at that time 6.0%.   -- Will follow during hospitalization.--  Jonna Clark RN, MSN Diabetes Coordinator Inpatient Glycemic Control Team Team Pager: [phone] (8am-5pm)

## 2018-08-01 NOTE — Progress Notes (Signed)
Inpatient Diabetes Program Recommendations  AACE/ADA: New Consensus Statement on Inpatient Glycemic Control (2015)  Target Ranges:  Prepandial:   less than 140 mg/dL      Peak postprandial:   less than 180 mg/dL (1-2 hours)      Critically ill patients:  140 - 180 mg/dL   Lab Results  Component Value Date   GLUCAP 152 (H) 08/01/2018   HGBA1C 11.2 (H) 08/01/2018   Met with patient to discuss diabetes management. He was already quite knowledgeable after talking with MD/bedside RN. He states this is his wake up call and he is determined he is going to "manage his diabetes better". States his dad is type 1 diabetic (recently had kidney AND heart transplant) and his brother was just diagnosed with diabetes a couple of year ago (brother is 29 years old).   Reviewed again LLWD book and insulin pen starter kit. Patient practiced using insulin pen demonstration and did very well. Educated patient on insulin pen use at home. Reviewed contents of insulin flexpen starter kit. Reviewed all steps of insulin pen including attachment of needle, 2-unit air shot, dialing up dose, giving injection, rotation of injection sites, removing needle, disposal of sharps, storage of unused insulin.  Patient able to provide successful return demonstration. Reviewed troubleshooting with insulin pen. Also reviewed Signs/Symptoms of Hypoglycemia with patient and how to treat Hypoglycemia at home. Have asked RNs caring for patient to please allow patient to give all injections here in hospital as much as possible for practice. Will attach information in AVS regarding insulin pen/hypoglycemia also.   Spoke with patient about current A1c of 11.2%. Explained what an A1c is and what it measures. Reminded patient that goal A1c is 7% or less per ADA standards to prevent both acute and long-term complications. Explained to patient the extreme importance of good glucose control at home. Encouraged patient to check CBGs prior to giving  insulin at home and to record all CBGs in a logbook for PCP to review.   Did see in computer a note 09/26/17 from Daisy Lazar PA at [place] (annual physical exam). He did have DM2 as a diagnosis from that visit but had no diabetic meds ordered. Hgb A1c at that time 6.0%. Patient said he still goes to this practice and will make a f/u appt. Understands the importance of having a PCP to help manage his diabetes.  His wife purchased a glucometer today. He has insurance / medication coverage and prefers to use insulin pens. I verified with him that he DOES have medication coverage with his insurance and he said HE DID. As a back up did review Rel[employer] brand information with patient that may be purchased at [employer] if insurance is no longer available.    MD - at discharge please order insulin PEN and PEN needles (Epic # [id number]).   Thank you .  -- Will follow during hospitalization.--  Jonna Clark RN, MSN Diabetes Coordinator Inpatient Glycemic Control Team Team Pager: [phone] (8am-5pm)

## 2018-08-01 NOTE — ED Provider Notes (Signed)
[facility] EMERGENCY DEPARTMENT Provider Note   CSN: [id number] Arrival date & time: 07/31/18  1853    History   Chief Complaint Chief Complaint  Patient presents with  . Multiple Complaints    HPI Justin Frankl Rio Kidane. is a 43 y.o. male.     The history is provided by the patient.  Weakness  Severity:  Moderate Onset quality:  Gradual Duration:  5 days Timing:  Intermittent Progression:  Worsening Chronicity:  New Relieved by:  Nothing Worsened by:  Nothing Associated symptoms: diarrhea, urgency and vision change   Associated symptoms: no abdominal pain, no chest pain, no cough, no fever and no vomiting    Patient reports for over 5 days he has had urinary frequency, numbness in his hands.  He has had Dry mouth.  He reports tonight he noted blurred vision.  He reports he has never had this before.  He reports being told he is prediabetic.  He reports this started after starting fish oil Past Medical History:  Diagnosis Date  . Asthma   . Hypertension   . Irregular heartbeat     Patient Active Problem List   Diagnosis Date Noted  . Asthma 07/06/2016  . Seasonal allergies 07/06/2016  . Sleep apnea 02/29/2016  . Gout 02/10/2015  . Nail bed injury 07/30/2014  . Essential hypertension 05/31/2014  . Family history of diabetes mellitus (DM) 05/31/2014  . Blurry vision 05/31/2014  . Accelerated hypertension 05/05/2014    History reviewed. No pertinent surgical history.      Home Medications    Prior to Admission medications   Medication Sig Start Date End Date Taking? Authorizing Provider  acetaminophen (TYLENOL) 500 MG tablet Take 1 tablet (500 mg total) by mouth every 8 (eight) hours as needed (pain). 12/14/16   Caccavale, Sophia, PA-C  albuterol (PROVENTIL HFA;VENTOLIN HFA) 108 (90 Base) MCG/ACT inhaler Inhale 2 puffs into the lungs every 4 (four) hours as needed for wheezing or shortness of breath. 07/06/16   Hoy Register, MD   amLODipine (NORVASC) 10 MG tablet Take 1 tablet (10 mg total) by mouth daily. 07/06/16   Hoy Register, MD  aspirin EC 81 MG tablet Take 81 mg by mouth daily.    [provider]  cetirizine (ZYRTEC) 10 MG tablet Take 1 tablet (10 mg total) by mouth daily. 07/06/16   Hoy Register, MD  colchicine 0.6 MG tablet Take 1 tablet (0.6 mg total) by mouth 2 (two) times daily. 01/02/17 01/09/17  Kellie Shropshire, PA-C  fluticasone (FLONASE) 50 MCG/ACT nasal spray Place 2 sprays into both nostrils daily. 07/06/16   Hoy Register, MD  losartan (COZAAR) 100 MG tablet Take 1 tablet (100 mg total) by mouth daily. 07/06/16   Hoy Register, MD    Family History Family History  Problem Relation Age of Onset  . Hypertension Mother   . Stroke Mother   . Hypertension Father   . Diabetes Father   . Heart disease Father   . Heart attack Paternal Uncle   . Heart attack Paternal Grandmother   . Heart disease Paternal Grandmother   . Heart attack Paternal Grandfather   . Heart disease Paternal Grandfather     Social History Social History   Tobacco Use  . Smoking status: Former Smoker    Years: 10.00    Last attempt to quit: 04/29/2008    Years since quitting: 10.2  . Smokeless tobacco: Never Used  . Tobacco comment: quit 3 years ago  Substance Use Topics  . Alcohol use: No  . Drug use: No     Allergies   Watermelon [citrullus vulgaris] and Lisinopril   Review of Systems Review of Systems  Constitutional: Negative for fever.  Eyes: Positive for visual disturbance.       Blurred vision   Respiratory: Negative for cough.   Cardiovascular: Negative for chest pain.  Gastrointestinal: Positive for diarrhea. Negative for abdominal pain and vomiting.  Endocrine: Positive for polydipsia and polyuria.  Genitourinary: Positive for urgency.  Neurological: Positive for weakness.  All other systems reviewed and are negative.    Physical Exam Updated Vital Signs BP (!) 154/81    Pulse 80   Temp 98 F (36.7 C) (Oral)   Resp 16   SpO2 100%   Physical Exam CONSTITUTIONAL: Well developed/well nourished HEAD: Normocephalic/atraumatic EYES: EOMI/PERRL ENMT: Mucous membranes moist NECK: supple no meningeal signs SPINE/BACK:entire spine nontender CV: S1/S2 noted, no murmurs/rubs/gallops noted LUNGS: Lungs are clear to auscultation bilaterally, no apparent distress ABDOMEN: soft, nontender, no rebound or guarding, bowel sounds noted throughout abdomen GU:no cva tenderness NEURO: Pt is awake/alert/appropriate, moves all extremitiesx4.  No facial droop.   EXTREMITIES: pulses normal/equal, full ROM SKIN: warm, color normal PSYCH: no abnormalities of mood noted, alert and oriented to situation   ED Treatments / Results  Labs (all labs ordered are listed, but only abnormal results are displayed) Labs Reviewed  BASIC METABOLIC PANEL - Abnormal; Notable for the following components:      Result Value   Sodium 134 (*)    Potassium 5.3 (*)    Chloride 96 (*)    CO2 20 (*)    Glucose, Bld 608 (*)    Creatinine, Ser 1.39 (*)    Anion gap 18 (*)    All other components within normal limits  BASIC METABOLIC PANEL - Abnormal; Notable for the following components:   Potassium 5.5 (*)    CO2 20 (*)    Glucose, Bld 442 (*)    Anion gap 16 (*)    All other components within normal limits  CBG MONITORING, ED - Abnormal; Notable for the following components:   Glucose-Capillary 580 (*)    All other components within normal limits  SARS CORONAVIRUS 2 (HOSPITAL ORDER, PERFORMED IN [facility])  CBC WITH DIFFERENTIAL/PLATELET  URINALYSIS, ROUTINE W REFLEX MICROSCOPIC    EKG EKG Interpretation  Date/Time:  Friday Aug 01 2018 00:42:44 EDT Ventricular Rate:  67 PR Interval:    QRS Duration: 96 QT Interval:  400 QTC Calculation: 423 R Axis:   76 Text Interpretation:  Sinus rhythm Consider right atrial enlargement Minimal ST elevation, lateral leads  Confirmed by Zadie Rhine ([id number]) on 08/01/2018 1:05:26 AM   Radiology No results found.  Procedures Procedures  CRITICAL CARE Performed by: Joya Gaskins Total critical care time: 33 minutes Critical care time was exclusive of separately billable procedures and treating other patients. Critical care was necessary to treat or prevent imminent or life-threatening deterioration. Critical care was time spent personally by me on the following activities: development of treatment plan with patient and/or surrogate as well as nursing, discussions with consultants, evaluation of patient's response to treatment, examination of patient, obtaining history from patient or surrogate, ordering and performing treatments and interventions, ordering and review of laboratory studies, ordering and review of radiographic studies, pulse oximetry and re-evaluation of patient's condition. Patient with new onset diabetes noted to have anion gap requiring IV insulin drip and admission  Medications Ordered in ED Medications  insulin regular, human (MYXREDLIN) 100 units/ 100 mL infusion (has no administration in time range)  dextrose 5 %-0.45 % sodium chloride infusion (has no administration in time range)  sodium chloride 0.9 % bolus 2,000 mL (0 mLs Intravenous Stopped 08/01/18 0141)  lactated ringers bolus 1,000 mL (0 mLs Intravenous Stopped 08/01/18 0344)     Initial Impression / Assessment and Plan / ED Course  I have reviewed the triage vital signs and the nursing notes.  Pertinent labs results that were available during my care of the patient were reviewed by me and considered in my medical decision making (see chart for details).        1:07 AM Patient with new onset diabetes.  Labs are pending. No acute distress 2:25 AM Patient will require a third liter of fluid and repeat labs 5:04 AM After 3 L of fluid, patient still has hyperglycemia, mild anion gap with depressed bicarb.  Suspect mild  DKA.  Patient has been in the ER for over 10 hours, still unable to improve his labs.  He would need immediate hospital.  IV insulin drip was ordered D/w dr Loney Lohrathore for admission  Final Clinical Impressions(s) / ED Diagnoses   Final diagnoses:  New onset type 2 diabetes mellitus (HCC)  Dehydration  Diabetic ketoacidosis without coma associated with other specified diabetes mellitus [facility](HCC)    ED Discharge Orders    None       Zadie RhineWickline, Ishanvi Mcquitty, MD 08/01/18 [phone]

## 2018-08-01 NOTE — Progress Notes (Signed)
Brief Nutrition Education Note   RD working remotely.  RD consulted for nutrition education regarding diabetes.   Lab Results  Component Value Date   HGBA1C 11.2 (H) 08/01/2018   PTA DM medications are none.   Labs reviewed: CBGS: 246-309 (inpatient orders for glycemic control are IV insulin drip ).   Chart reviewed; pt with prior diagnosis of DM from annual physical note on 09/26/17. Pt also went to bariatric clinic on 10/10/17 due to desire to lose weight. Per DM coordinator note, pt will likely discharge on insulin and plans to speak with pt later today regarding options.  Attempted to speak with pt on the phone to obtain history and provide education, however, unable to reach pt at this time. Phone message stated "call cannot be completed as dialed". RD also ordered referral for outpatient diabetes education through [facility]'s Nutrition and Diabetes Education Services, for further reinforcement after discharge.   RD provided "Carbohydrate Counting for People with Diabetes" and "Using Nutrition Labels: Carbohydrates" handouts from the Academy of Nutrition and Dietetics. Handouts attached to AVS/ discharge instructions.  Estimated body mass index is 36.62 kg/m as calculated from the following:   Height as of 12/14/16: 6' (1.829 m).   Weight as of 12/14/16: 122.5 kg. Pt meets criteria for obesity, class II based on current BMI.  Current diet order is NPO, patient is consuming approximately n/a% of meals at this time. Labs and medications reviewed. No further nutrition interventions warranted at this time. RD contact information provided. If additional nutrition issues arise, please re-consult RD.  Murna Backer A. Mayford Knife, RD, LDN, CDCES Registered Dietitian II Certified Diabetes Care and Education Specialist Pager: [phone] After hours Pager: [phone]

## 2018-08-01 NOTE — Progress Notes (Signed)
Patient  insulin drip discontinue as order by MD.  novolog and Lantus given . Pt diabetic education given with the insulin kit pen.

## 2018-08-01 NOTE — ED Notes (Signed)
ED TO INPATIENT HANDOFF REPORT  ED Nurse Name and Phone #:  Erin Fulling [phone]  S Name/Age/Gender Justin Liu. 43 y.o. male Room/Bed: 034C/034C  Code Status   Code Status: Full Code  Home/SNF/Other Home Patient oriented to: self, place, time and situation Is this baseline? Yes   Triage Complete: Triage complete  Chief Complaint Frequent Urination,Numb finger tips  Triage Note Pt c/o frequent urination, numbness in his fingers, dry mouth, and trouble with his vision x 5 days   Allergies Allergies  Allergen Reactions  . Watermelon [Citrullus Vulgaris] Anaphylaxis  . Lisinopril Hives    Swollen gums    Level of Care/Admitting Diagnosis ED Disposition    ED Disposition Condition Comment   Admit  Hospital Area: [facility] [[id number]]  Level of Care: Progressive [102]  I expect the patient will be discharged within 24 hours: No (not a candidate for 5C-Observation unit)  Covid Evaluation: Person Under Investigation (PUI)  Isolation Risk Level: Low Risk/Droplet (Less than 4L [place] supplementation)  Diagnosis: DKA (diabetic ketoacidoses) [facility]) [119147]  Admitting Physician: John Giovanni [[id number]]  Attending Physician: John Giovanni [[id number]]  PT Class (Do Not Modify): Observation [104]  PT Acc Code (Do Not Modify): Observation [10022]       B Medical/Surgery History Past Medical History:  Diagnosis Date  . Asthma   . Hypertension   . Irregular heartbeat    History reviewed. No pertinent surgical history.   A IV Location/Drains/Wounds Patient Lines/Drains/Airways Status   Active Line/Drains/Airways    Name:   Placement date:   Placement time:   Site:   Days:   Peripheral IV 08/01/18 Right Forearm   08/01/18    0105    Forearm   less than 1          Intake/Output Last 24 hours  Intake/Output Summary (Last 24 hours) at 08/01/2018 0532 Last data filed at 08/01/2018 0348 Gross per 24 hour  Intake 4065 ml  Output 1000  ml  Net 3065 ml    Labs/Imaging Results for orders placed or performed during the hospital encounter of 07/31/18 (from the past 48 hour(s))  CBG monitoring, ED     Status: Abnormal   Collection Time: 08/01/18 12:26 AM  Result Value Ref Range   Glucose-Capillary 580 (HH) 70 - 99 mg/dL  Basic metabolic panel     Status: Abnormal   Collection Time: 08/01/18 12:45 AM  Result Value Ref Range   Sodium 134 (L) 135 - 145 mmol/L   Potassium 5.3 (H) 3.5 - 5.1 mmol/L   Chloride 96 (L) 98 - 111 mmol/L   CO2 20 (L) 22 - 32 mmol/L   Glucose, Bld 608 (HH) 70 - 99 mg/dL    Comment: CRITICAL RESULT CALLED TO, READ BACK BY AND VERIFIED WITH: Makih Stefanko A,RN 08/01/18 0131 WAYK    BUN 18 6 - 20 mg/dL   Creatinine, Ser 4.69 (H) 0.61 - 1.24 mg/dL   Calcium 62.9 8.9 - 52.8 mg/dL   GFR calc non Af Amer >60 >60 mL/min   GFR calc Af Amer >60 >60 mL/min   Anion gap 18 (H) 5 - 15    Comment: Performed at [facility], [address], [place], [place] [address]  CBC with Differential/Platelet     Status: None   Collection Time: 08/01/18 12:45 AM  Result Value Ref Range   WBC 7.7 4.0 - 10.5 K/uL   RBC 4.91 4.22 - 5.81 MIL/uL   Hemoglobin 15.3  13.0 - 17.0 g/dL   HCT 35.6 86.1 - 68.3 %   MCV 91.2 80.0 - 100.0 fL   MCH 31.2 26.0 - 34.0 pg   MCHC 34.2 30.0 - 36.0 g/dL   RDW 72.9 02.1 - 11.5 %   Platelets 280 150 - 400 K/uL   nRBC 0.0 0.0 - 0.2 %   Neutrophils Relative % 60 %   Neutro Abs 4.5 1.7 - 7.7 K/uL   Lymphocytes Relative 34 %   Lymphs Abs 2.6 0.7 - 4.0 K/uL   Monocytes Relative 6 %   Monocytes Absolute 0.5 0.1 - 1.0 K/uL   Eosinophils Relative 0 %   Eosinophils Absolute 0.0 0.0 - 0.5 K/uL   Basophils Relative 0 %   Basophils Absolute 0.0 0.0 - 0.1 K/uL   Immature Granulocytes 0 %   Abs Immature Granulocytes 0.02 0.00 - 0.07 K/uL    Comment: Performed at [facility], [address] [place], [place] [address]  Basic metabolic panel     Status: Abnormal   Collection Time:  08/01/18  3:30 AM  Result Value Ref Range   Sodium 139 135 - 145 mmol/L   Potassium 5.5 (H) 3.5 - 5.1 mmol/L   Chloride 103 98 - 111 mmol/L   CO2 20 (L) 22 - 32 mmol/L   Glucose, Bld 442 (H) 70 - 99 mg/dL   BUN 15 6 - 20 mg/dL   Creatinine, Ser 2.23 0.61 - 1.24 mg/dL   Calcium 9.4 8.9 - 36.1 mg/dL   GFR calc non Af Amer >60 >60 mL/min   GFR calc Af Amer >60 >60 mL/min   Anion gap 16 (H) 5 - 15    Comment: Performed at [facility], [address], [place], [place] [address]  CBG monitoring, ED     Status: Abnormal   Collection Time: 08/01/18  5:07 AM  Result Value Ref Range   Glucose-Capillary 393 (H) 70 - 99 mg/dL   No results found.  Pending Labs Unresulted Labs (From admission, onward)    Start     Ordered   08/01/18 0730  Basic metabolic panel  Once,   STAT     08/01/18 0530   08/01/18 0531  Hemoglobin A1c  Once,   R     08/01/18 0530   08/01/18 0527  HIV antibody (Routine Testing)  Once,   R     08/01/18 0530   08/01/18 0506  SARS Coronavirus 2 (CEPHEID - Performed in [facility] hospital lab), Hosp Order  (Asymptomatic Patients Labs)  ONCE - STAT,   R    Question:  Rule Out  Answer:  Yes   08/01/18 0505   08/01/18 0448  Urinalysis, Routine w reflex microscopic  Once,   R     08/01/18 0447          Vitals/Pain Today's Vitals   08/01/18 0023 08/01/18 0030 08/01/18 0345 08/01/18 0523  BP:  (!) 154/81  (!) 146/100  Pulse:  80  61  Resp:    12  Temp:    98.6 F (37 C)  TempSrc:    Oral  SpO2:  100%  100%  PainSc: 0-No pain  0-No pain     Isolation Precautions No active isolations  Medications Medications  dextrose 5 %-0.45 % sodium chloride infusion (has no administration in time range)  insulin regular, human (MYXREDLIN) 100 units/ 100 mL infusion (has no administration in time range)  enoxaparin (LOVENOX) injection 40  mg (has no administration in time range)  0.9 %  sodium chloride infusion (has no administration in time range)  sodium chloride  0.9 % bolus 2,000 mL (0 mLs Intravenous Stopped 08/01/18 0141)  lactated ringers bolus 1,000 mL (0 mLs Intravenous Stopped 08/01/18 0344)    Mobility walks Low fall risk   Focused Assessments   Teaching about Diabetes.     R Recommendations: See Admitting Provider Note  Report given to:   Additional Notes: Alert oriented x 4

## 2018-08-02 DIAGNOSIS — I10 Essential (primary) hypertension: Secondary | ICD-10-CM

## 2018-08-02 DIAGNOSIS — J4520 Mild intermittent asthma, uncomplicated: Secondary | ICD-10-CM

## 2018-08-02 LAB — BASIC METABOLIC PANEL
Anion gap: 11 (ref 5–15)
BUN: 9 mg/dL (ref 6–20)
CO2: 24 mmol/L (ref 22–32)
Calcium: 9.1 mg/dL (ref 8.9–10.3)
Chloride: 100 mmol/L (ref 98–111)
Creatinine, Ser: 1.02 mg/dL (ref 0.61–1.24)
GFR calc Af Amer: >60 mL/min (ref >60)
GFR calc non Af Amer: >60 mL/min (ref >60)
Glucose, Bld: 281 mg/dL — ABNORMAL HIGH (ref 70–99)
Potassium: 3 mmol/L — ABNORMAL LOW (ref 3.5–5.1)
Sodium: 135 mmol/L (ref 135–145)

## 2018-08-02 LAB — GLUCOSE, CAPILLARY
Glucose-Capillary: 299 mg/dL — ABNORMAL HIGH (ref 70–99)
Glucose-Capillary: 366 mg/dL — ABNORMAL HIGH (ref 70–99)
Glucose-Capillary: 372 mg/dL — ABNORMAL HIGH (ref 70–99)
Glucose-Capillary: 279 mg/dL — ABNORMAL HIGH (ref 70–99)

## 2018-08-02 MED ORDER — INSULIN ASPART 100 UNIT/ML ~~LOC~~ SOLN: 8.0000 [IU] | Freq: Three times a day (TID) | SUBCUTANEOUS | 3 refills | Status: DC

## 2018-08-02 MED ORDER — POTASSIUM CHLORIDE CRYS ER 20 MEQ PO TBCR
40.0000 meq | EXTENDED_RELEASE_TABLET | Freq: Once | ORAL | Status: AC
  Administered 2018-08-02: 40 meq via ORAL
  Filled 2018-08-02: qty 2

## 2018-08-02 MED ORDER — INSULIN GLARGINE 100 UNIT/ML ~~LOC~~ SOLN
5.0000 [IU] | Freq: Once | SUBCUTANEOUS | Status: AC
  Administered 2018-08-02: 5 [IU] via SUBCUTANEOUS
  Filled 2018-08-02: qty 0.05

## 2018-08-02 MED ORDER — INSULIN ASPART 100 UNIT/ML FLEXPEN: 0.0000 [IU] | PEN_INJECTOR | Freq: Three times a day (TID) | SUBCUTANEOUS | 11 refills | Status: DC

## 2018-08-02 MED ORDER — ATORVASTATIN CALCIUM 20 MG PO TABS: 20.0000 mg | ORAL_TABLET | Freq: Every day | ORAL | 3 refills | Status: DC

## 2018-08-02 MED ORDER — INSULIN GLARGINE 100 UNIT/ML SOLOSTAR PEN: 30.0000 [IU] | PEN_INJECTOR | Freq: Every day | SUBCUTANEOUS | 11 refills | Status: DC

## 2018-08-02 MED ORDER — INSULIN ASPART 100 UNIT/ML ~~LOC~~ SOLN: 0.0000 [IU] | Freq: Three times a day (TID) | SUBCUTANEOUS | 3 refills | Status: DC

## 2018-08-02 MED ORDER — INSULIN ASPART 100 UNIT/ML ~~LOC~~ SOLN
8.0000 [IU] | Freq: Three times a day (TID) | SUBCUTANEOUS | Status: DC
  Administered 2018-08-02: 8 [IU] via SUBCUTANEOUS
  Filled 2018-08-02: qty 0.08

## 2018-08-02 MED ORDER — INSULIN ASPART 100 UNIT/ML FLEXPEN: 8.0000 [IU] | PEN_INJECTOR | Freq: Three times a day (TID) | SUBCUTANEOUS | 11 refills | Status: DC

## 2018-08-02 MED ORDER — COLCHICINE 0.6 MG PO TABS: 0.6000 mg | ORAL_TABLET | Freq: Two times a day (BID) | ORAL | Status: DC | PRN

## 2018-08-02 MED ORDER — INSULIN SYRINGES (DISPOSABLE) U-100 0.5 ML MISC: 0.0000 [IU] | Freq: Three times a day (TID) | 3 refills | Status: DC

## 2018-08-02 MED ORDER — INSULIN ASPART 100 UNIT/ML ~~LOC~~ SOLN
5.0000 [IU] | Freq: Three times a day (TID) | SUBCUTANEOUS | Status: DC
  Administered 2018-08-02: 5 [IU] via SUBCUTANEOUS

## 2018-08-02 MED ORDER — INSULIN PEN NEEDLE 32G X 4 MM MISC: 8.0000 [IU] | Freq: Three times a day (TID) | 3 refills | Status: DC

## 2018-08-02 MED ORDER — INSULIN GLARGINE 100 UNIT/ML ~~LOC~~ SOLN: 30.0000 [IU] | Freq: Every day | SUBCUTANEOUS | 3 refills | Status: DC

## 2018-08-02 MED ORDER — INSULIN GLARGINE 100 UNIT/ML ~~LOC~~ SOLN
20.0000 [IU] | Freq: Every day | SUBCUTANEOUS | Status: DC
  Administered 2018-08-02: 09:00:00 20 [IU] via SUBCUTANEOUS
  Filled 2018-08-02: qty 0.2

## 2018-08-02 MED ORDER — LOSARTAN POTASSIUM 50 MG PO TABS
100.0000 mg | ORAL_TABLET | Freq: Every day | ORAL | Status: DC
  Administered 2018-08-02: 100 mg via ORAL
  Filled 2018-08-02: qty 2

## 2018-08-02 NOTE — Progress Notes (Signed)
Patient discharged home per orders. Discharge instructions reviewed with patient per Denny Peon, RN. Patient verbalizes understanding. IV and tele discontinued per orders per Denny Peon, RN. Belongings packed up by patient. Denies needs at this time. Patient off unit via w/c by NT.

## 2018-08-02 NOTE — Progress Notes (Signed)
Inpatient Diabetes Program Recommendations  AACE/ADA: New Consensus Statement on Inpatient Glycemic Control (2015)  Target Ranges:  Prepandial:   less than 140 mg/dL      Peak postprandial:   less than 180 mg/dL (1-2 hours)      Critically ill patients:  140 - 180 mg/dL    Spoke with Dr. Isidoro Donning about plan of care. Insulin orders adjusted.  Attached detail information on the type of insulins he will be on, how often to take, how they work, when to check glucose.  Spoke with patient over the phone and spoke in detail about the insulins he will be on at time of d/c. Spoke with patient about basal insulin (lantus or levemir) and short acting insulin at the meal times for correction and meal coverage. Described both doses will be combined in the same injection before meals.  Spoke in detail about diet, things he can drink, carbohydrates, portion control and portion sizes.   Discussed when to check glucose. Encouraged follow up with PCP and Specialist.  Patient's understanding adequate for discharge.  Thanks,  Christena Deem RN, MSN, BC-ADM Inpatient Diabetes Coordinator Team Pager [phone] (8a-5p)

## 2018-08-02 NOTE — Care Management (Signed)
Spoke w patient, confirmed he does not have insurance. Notified Dr Isidoro Donning, requested her to write for vials of insulin instead of flex pens, as vials can be covered at his pharmacy with the [facility] letter. Patient provided with [facility] letter, explained use. Over ride entered for Lantus and colchicine.

## 2018-08-02 NOTE — Discharge Summary (Addendum)
Physician Discharge Summary   Patient ID: Justin Liu. MRN: [id number] DOB/AGE: 05/25/1975 43 y.o.  Admit date: 07/31/2018 Discharge date: 08/02/2018  Primary Care Physician:  System, Pcp Not In   Recommendations for Outpatient Follow-up:  1. Follow up with PCP in 1-2 weeks 2. Please follow hemoglobin A1c 3. Patient will follow with endocrinology and adjust insulin regimen according to CBG readings.  Patient was recommended to keep a log of his blood sugar readings for the follow-up appointment  Home Health: None  Equipment/Devices:   Discharge Condition: stable  CODE STATUS: FULL  Diet recommendation: Carb modified diet   Discharge Diagnoses:    . DKA (diabetic ketoacidoses) (HCC) New onset diabetes mellitus type 2 . Asthma . Essential hypertension Hyperlipidemia Mild acute kidney injury Profound dehydration  Consults: None    Allergies:   Allergies  Allergen Reactions  . Watermelon [Citrullus Vulgaris] Anaphylaxis  . Lisinopril Hives    Swollen gums     DISCHARGE MEDICATIONS: Allergies as of 08/02/2018      Reactions   Watermelon [citrullus Vulgaris] Anaphylaxis   Lisinopril Hives   Swollen gums      Medication List    STOP taking these medications   aspirin EC 81 MG tablet   ibuprofen 600 MG tablet Commonly known as:  ADVIL   predniSONE 10 MG tablet Commonly known as:  DELTASONE     TAKE these medications   acetaminophen 500 MG tablet Commonly known as:  TYLENOL Take 1 tablet (500 mg total) by mouth every 8 (eight) hours as needed (pain).   albuterol 108 (90 Base) MCG/ACT inhaler Commonly known as:  VENTOLIN HFA Inhale 2 puffs into the lungs every 4 (four) hours as needed for wheezing or shortness of breath.   amLODipine 10 MG tablet Commonly known as:  NORVASC Take 1 tablet (10 mg total) by mouth daily.   atorvastatin 20 MG tablet Commonly known as:  Lipitor Take 1 tablet (20 mg total) by mouth at bedtime.    cetirizine 10 MG tablet Commonly known as:  ZYRTEC Take 1 tablet (10 mg total) by mouth daily.   colchicine 0.6 MG tablet Take 1 tablet (0.6 mg total) by mouth 2 (two) times daily as needed for up to 7 days (gout pain). What changed:    when to take this  reasons to take this   fluticasone 50 MCG/ACT nasal spray Commonly known as:  FLONASE Place 2 sprays into both nostrils daily.   insulin aspart 100 UNIT/ML injection Commonly known as:  NovoLOG Inject 8 Units into the skin 3 (three) times daily with meals.   insulin aspart 100 UNIT/ML injection Commonly known as:  NovoLOG Inject 0-9 Units into the skin 3 (three) times daily with meals. CORRECTION FACTOR Sliding scale CBG 70 - 120: 0 units CBG 121 - 150: 1 unit,  CBG 151 - 200: 2 units,  CBG 201 - 250: 3 units,  CBG 251 - 300: 5 units,  CBG 301 - 350: 7 units,  CBG 351 - 400: 9 units   CBG > 400: 9 units and notify your MD   insulin glargine 100 UNIT/ML injection Commonly known as:  LANTUS Inject 0.3 mLs (30 Units total) into the skin daily.   Insulin Syringes (Disposable) U-100 0.5 ML Misc 0-9 Units by Does not apply route 3 (three) times daily with meals.   losartan 100 MG tablet Commonly known as:  COZAAR Take 1 tablet (100 mg total) by mouth daily.  Brief H and P: For complete details please refer to admission H and P, but in brief  Patient is a 43 year old male with history of asthma, hypertension presented to ED with polydipsia and polyuria for last 2 weeks.  Patient reported drinking a lot of apple juice, mixing it with water, eating a lot of fruit recently.  For the last 2 days felt very lethargic, blurry vision.  Patient reported family history of diabetes. In ED, CBG was 608, bicarb 20, gap 18, potassium 5.3, creatinine 1.3, baseline 1.1.  UA positive for glucosuria.  Patient received liters of IV fluids and was admitted with DKA protocol on insulin infusion.   Hospital Course:   DKA (diabetic  ketoacidoses) [facility]) with diagnosis of new onset type 2 diabetes mellitus -Patient has family history of diabetes with multiple members having DM. -CBG 608 at the time of admission, creatinine 1.39, gap 18 with hyperkalemia -Patient was placed on IV fluid hydration with IV insulin drip.   -Hemoglobin A1c 11.2 -Discussed in detail with the patient regarding DKA, new onset DM.  He also received extensive teaching from RN regarding FlexPen injections, diabetic coordinator and dietitian. -Upon discharge, placed on 30 units Lantus FlexPen's daily, NovoLog FlexPen meal coverage 8 units 3 times daily AC and sliding scale insulin sensitive Patient will follow-up with Dr. Elvera Lennox, endocrinology for further adjustment in insulin regimen.     Essential hypertension -BP was somewhat elevated, patient was placed on amlodipine and losartan.   Mild acute kidney injury likely due to profound dehydration and DKA -Creatinine 1.39 at the time of admission, patient was placed on IV fluid hydration Improved, creatinine 1.0 at the time of discharge.  Hyperlipidemia -LDL 159, cholesterol 215, triglycerides 154 -Goal LDL less than 100,  placed on Lipitor 20 mg daily.  Likely triglycerides and LDL will improve with better diabetic control.    Day of Discharge S: Still somewhat overwhelmed however doing better with understanding of insulin regimen and teaching.  Wants to go home today.  BP (!) 135/96   Pulse 63   Temp 98.1 F (36.7 C) (Oral)   Resp 14   SpO2 100%   Physical Exam: General: Alert and awake oriented x3 not in any acute distress. HEENT: anicteric sclera, pupils reactive to light and accommodation CVS: S1-S2 clear no murmur rubs or gallops Chest: clear to auscultation bilaterally, no wheezing rales or rhonchi Abdomen: soft nontender, nondistended, normal bowel sounds Extremities: no cyanosis, clubbing or edema noted bilaterally Neuro: Cranial nerves II-XII intact, no focal neurological  deficits   The results of significant diagnostics from this hospitalization (including imaging, microbiology, ancillary and laboratory) are listed below for reference.      Procedures/Studies:  No results found.    LAB RESULTS: Basic Metabolic Panel: Recent Labs  Lab 08/01/18 1730 08/02/18 0314  NA 139 135  K 3.2* 3.0*  CL 102 100  CO2 25 24  GLUCOSE 159* 281*  BUN 8 9  CREATININE 0.97 1.02  CALCIUM 9.4 9.1   Liver Function Tests: No results for input(s): AST, ALT, ALKPHOS, BILITOT, PROT, ALBUMIN in the last 168 hours. No results for input(s): LIPASE, AMYLASE in the last 168 hours. No results for input(s): AMMONIA in the last 168 hours. CBC: Recent Labs  Lab 08/01/18 0045  WBC 7.7  NEUTROABS 4.5  HGB 15.3  HCT 44.8  MCV 91.2  PLT 280   Cardiac Enzymes: No results for input(s): CKTOTAL, CKMB, CKMBINDEX, TROPONINI in the last 168 hours. BNP: Invalid input(s):  POCBNP CBG: Recent Labs  Lab 08/02/18 1156 08/02/18 1456  GLUCAP 366* 372*      Disposition and Follow-up: Discharge Instructions    Amb Referral to Nutrition and Diabetic E   Complete by:  As directed    Diet Carb Modified   Complete by:  As directed    Discharge instructions   Complete by:  As directed    CORRECTION FACTOR SLIDING SCALE INSULIN (please take in addition to meal coverage) Sliding scale CBG 70 - 120: 0 units CBG 121 - 150: 1 unit,  CBG 151 - 200: 2 units,  CBG 201 - 250: 3 units,  CBG 251 - 300: 5 units,  CBG 301 - 350: 7 units,  CBG 351 - 400: 9 units   CBG > 400: 9 units and notify your MD    It is VERY IMPORTANT that you follow up with a PCP on a regular basis.  Check your blood glucoses before each meal and at bedtime and maintain a log of your readings.  Bring this log with you when you follow up with your doctor so that he or she can adjust your insulin at your follow up visit.   Increase activity slowly   Complete by:  As directed        DISPOSITION:  Home   DISCHARGE FOLLOW-UP Follow-up Information    [facility] Follow up on 08/12/2018.   Why:  2:30 pm, Gwinda PasseMichelle Edwards NP Contact information: [address] [place] [phone]       Carlus PavlovGherghe, Cristina, MD. Schedule an appointment as soon as possible for a visit.   Specialty:  Internal Medicine Why:  Office will call you on Tuesday to schedule appointment.  If you do not receive any call, please call office on Wednesday, 08/06/18 to make appt with Dr. Elvera LennoxGherghe.  Contact information: [address] [place] [phone]            Time coordinating discharge:  40 minutes  Signed:   Thad Rangeripudeep Aedyn Mckeon M.D. [facility] 08/02/2018, 5:05 PM

## 2018-08-05 ENCOUNTER — Telehealth: Payer: Self-pay | Admitting: Internal Medicine

## 2018-08-05 MED FILL — NOVOLOG FLEXPEN SYRINGE: 100 | 28 days supply | Qty: 9 | Fill #0

## 2018-08-05 NOTE — Telephone Encounter (Signed)
Left message to schedule patient as a new patient with Dr.Gherghe on Thursday for patients DM. Referral from Dr.Rai.

## 2018-08-06 ENCOUNTER — Encounter: Payer: Self-pay | Admitting: Internal Medicine

## 2018-08-07 ENCOUNTER — Telehealth: Payer: Self-pay | Admitting: Emergency Medicine

## 2018-08-07 ENCOUNTER — Telehealth: Payer: Self-pay | Admitting: Pharmacist

## 2018-08-07 NOTE — Telephone Encounter (Signed)
Patients call returned.  Patient identified by name and date of birth.  Patient called to see what CBG was.  CBG @ 1500 is 300.  RN verified with patient new Lantus dose and gave dietary advise.  Patient acknowledged understanding of advice.

## 2018-08-07 NOTE — Telephone Encounter (Signed)
Call received from patient. Reports blood sugars at home have not come down. Gives range 300-400s. Reports adherence to Lantus and Humalog. Currently taking 30 units of Lantus. Denies polyuria, polydipsia but endorses some blurred vision. Denies hypoglycemia. No problems with injecting.   Advised pt to increase from 30 to 40 units of Lantus. He is to follow-up with Marcelino Duster 08/12/18.

## 2018-08-07 NOTE — Telephone Encounter (Signed)
Patients call taken.  Patient identified by name and date of birth.  Patient states recently got out of the hospital with a diagnosis of diabetes.  Was prescribed two insulins.  Patient also has a meter and has been monitoring his sugars.  Patient states he was advised that if his sugars exceeded 400 to call Clinic.  Patients CBG upon waking was 435.  Patient had just prior to calling taken 9 units of Aspart.  Patient advised that this RN would call back in an hour and patient would recheck his CBG and a determination would be made at that time.  Patient acknowledged understanding of advice.

## 2018-08-12 ENCOUNTER — Ambulatory Visit: Payer: Self-pay | Attending: Primary Care | Admitting: Primary Care

## 2018-08-12 ENCOUNTER — Encounter: Payer: Self-pay | Admitting: Primary Care

## 2018-08-12 ENCOUNTER — Telehealth: Payer: Self-pay

## 2018-08-12 ENCOUNTER — Other Ambulatory Visit: Payer: Self-pay

## 2018-08-12 DIAGNOSIS — Z20822 Contact with and (suspected) exposure to covid-19: Secondary | ICD-10-CM

## 2018-08-12 DIAGNOSIS — Z09 Encounter for follow-up examination after completed treatment for conditions other than malignant neoplasm: Secondary | ICD-10-CM

## 2018-08-12 DIAGNOSIS — I10 Essential (primary) hypertension: Secondary | ICD-10-CM

## 2018-08-12 DIAGNOSIS — M1000 Idiopathic gout, unspecified site: Secondary | ICD-10-CM

## 2018-08-12 NOTE — Progress Notes (Signed)
Virtual Visit via Telephone Note  I connected with Justin Liu. on 08/12/18 at  2:30 PM EDT by telephone and verified that I am speaking with the correct person using two identifiers.   I discussed the limitations, risks, security and privacy concerns of performing an evaluation and management service by telephone and the availability of in person appointments. I also discussed with the patient that there may be a patient responsible charge related to this service. The patient expressed understanding and agreed to proceed.   History of Present Illness: Justin Liu presented to the emergency room with complaints of polydipsia and polyuria for the last 2 weeks.  He admits to eating and drinking a lot of fruit recently.  He started feeling lethargic and family encouraged him to be seen at the emergency room.  He has a past medical history of hypertension and asthma.  He was admitted to the hospital of a blood sugar of 608 treatment started in the ED with 3 L IV fluid bolus and started on insulin infusion.   Observations/Objective: Review of Systems  Constitutional: Positive for malaise/fatigue.  HENT: Negative.   Eyes: Negative.   Respiratory: Negative.   Cardiovascular: Negative.   Gastrointestinal: Negative.   Genitourinary: Positive for frequency.  Musculoskeletal: Negative.   Skin: Negative.   Neurological: Negative.   Endo/Heme/Allergies: Positive for polydipsia.  Psychiatric/Behavioral: Negative.     Assessment and Plan: Justin Liu was seen today for diabetes.  Diagnoses and all orders for this visit:  Accelerated hypertension Blood pressures systolic range from 102-154 and diastolic range from 60-100.  Prescribed amlodipine 10 mg and losartan 100.  Unable to check blood pressures at home on follow-up will address effectiveness of medication and reevaluate blood pressure.  Hospital discharge follow-up Instructed at discharge to follow-up with PCP none on file and repeat  CBG and manage new diagnosis of type 2 diabetes.  A1c was 11.2.  Idiopathic gout, unspecified chronicity, unspecified site Placed on colchicine 0.6 mg twice daily as needed on follow-up will check uric acid.  Type 2 diabetes This is a new diagnosis he was discharged on sliding scale insulin and Lantus 30 units daily will have patient to follow-up with clinical pharmacist for diabetic teaching and management of diabetes.  Follow Up Instructions:    I discussed the assessment and treatment plan with the patient. The patient was provided an opportunity to ask questions and all were answered. The patient agreed with the plan and demonstrated an understanding of the instructions.   The patient was advised to call back or seek an in-person evaluation if the symptoms worsen or if the condition fails to improve as anticipated.  I provided 28 minutes of non-face-to-face time during this encounter.  This includes reading notes, labs.   Grayce Sessions, NP

## 2018-08-12 NOTE — Telephone Encounter (Signed)
Phone call to pt.  Advised during recent hospital stay, he possibly was exposed to a staff member that tested positive for COVID 19, after he had been discharged.  Offered appt. For testing.  Pt. Accepted an appt. On 08/13/18 @ 8:00 AM, at the [facility].  Verb.  Understanding of instructions.

## 2018-08-12 NOTE — Progress Notes (Signed)
Patient verified DOB Patient has taken medication. Patient has not eaten. Patient denies pain at this time. CBG: 218 at 2:18. Lantus is still at 30 doses. Patient has been seeing 200's and checking it 3-4 times day. Patient lantus between 10-11am.

## 2018-08-13 ENCOUNTER — Other Ambulatory Visit: Payer: Self-pay

## 2018-08-13 DIAGNOSIS — Z20822 Contact with and (suspected) exposure to covid-19: Secondary | ICD-10-CM

## 2018-08-15 LAB — NOVEL CORONAVIRUS, NAA: SARS-CoV-2, NAA: NOT DETECTED

## 2018-08-20 ENCOUNTER — Ambulatory Visit: Payer: Self-pay | Admitting: Registered"

## 2018-08-20 NOTE — Progress Notes (Deleted)
11.2% 2 wks ago

## 2018-08-24 DIAGNOSIS — Z09 Encounter for follow-up examination after completed treatment for conditions other than malignant neoplasm: Secondary | ICD-10-CM | POA: Insufficient documentation

## 2018-08-26 ENCOUNTER — Telehealth: Payer: Self-pay | Admitting: Primary Care

## 2018-08-26 ENCOUNTER — Other Ambulatory Visit: Payer: Self-pay | Admitting: Family Medicine

## 2018-08-26 DIAGNOSIS — I10 Essential (primary) hypertension: Secondary | ICD-10-CM

## 2018-08-26 MED ORDER — AMLODIPINE BESYLATE 10 MG PO TABS: 10.0000 mg | ORAL_TABLET | Freq: Every day | ORAL | 0 refills | Status: DC

## 2018-08-26 MED ORDER — LOSARTAN POTASSIUM 100 MG PO TABS: 100.0000 mg | ORAL_TABLET | Freq: Every day | ORAL | 2 refills | Status: DC

## 2018-08-26 MED FILL — !LANTUS SOLOSTAR 100UNITS/M: 100 | 30 days supply | Qty: 9 | Fill #0

## 2018-08-26 MED FILL — !NOVOLOG FLEXPEN SYRINGE 1: 100/ML | 28 days supply | Qty: 9 | Fill #1

## 2018-08-26 MED FILL — ?AMLODIPINE BESYLATE 10 MG: 10 | 30 days supply | Qty: 30 | Fill #0

## 2018-08-26 MED FILL — LOSARTAN POTASSIUM 100 MG T: 100 | 30 days supply | Qty: 30 | Fill #0

## 2018-08-26 NOTE — Telephone Encounter (Signed)
1) Medication(s) Requested (by name): losartan (COZAAR) 100 MG tablet  amLODipine (NORVASC) 10 MG tablet     2) Pharmacy of Choice: [facility] pharmacy   3) Special Requests:   Approved medications will be sent to the pharmacy, we will reach out if there is an issue.  Requests made after 3pm may not be addressed until the following business day!  If a patient is unsure of the name of the medication(s) please note and ask patient to call back when they are able to provide all info, do not send to responsible party until all information is available!

## 2018-09-16 ENCOUNTER — Other Ambulatory Visit: Payer: Self-pay

## 2018-09-18 ENCOUNTER — Ambulatory Visit: Payer: Self-pay | Admitting: Internal Medicine

## 2018-09-18 DIAGNOSIS — Z0289 Encounter for other administrative examinations: Secondary | ICD-10-CM

## 2018-09-18 NOTE — Patient Instructions (Signed)
Please continue: - Lantus 30 units at bedtime - NovoLog 8 to 9 units 15 minutes before meals  Please let me know if the sugars are consistently <80 or >200.  Please return in 2 months with your sugar log.   PATIENT INSTRUCTIONS FOR TYPE 2 DIABETES:  **Please join MyChart!** - see attached instructions about how to join if you have not done so already.  DIET AND EXERCISE Diet and exercise is an important part of diabetic treatment.  We recommended aerobic exercise in the form of brisk walking (working between 40-60% of maximal aerobic capacity, similar to brisk walking) for 150 minutes per week (such as 30 minutes five days per week) along with 3 times per week performing 'resistance' training (using various gauge rubber tubes with handles) 5-10 exercises involving the major muscle groups (upper body, lower body and core) performing 10-15 repetitions (or near fatigue) each exercise. Start at half the above goal but build slowly to reach the above goals. If limited by weight, joint pain, or disability, we recommend daily walking in a swimming pool with water up to waist to reduce pressure from joints while allow for adequate exercise.    BLOOD GLUCOSES Monitoring your blood glucoses is important for continued management of your diabetes. Please check your blood glucoses 2-4 times a day: fasting, before meals and at bedtime (you can rotate these measurements - e.g. one day check before the 3 meals, the next day check before 2 of the meals and before bedtime, etc.).   HYPOGLYCEMIA (low blood sugar) Hypoglycemia is usually a reaction to not eating, exercising, or taking too much insulin/ other diabetes drugs.  Symptoms include tremors, sweating, hunger, confusion, headache, etc. Treat IMMEDIATELY with 15 grams of Carbs: . 4 glucose tablets .  cup regular juice/soda . 2 tablespoons raisins . 4 teaspoons sugar . 1 tablespoon honey Recheck blood glucose in 15 mins and repeat above if still  symptomatic/blood glucose <100.  RECOMMENDATIONS TO REDUCE YOUR RISK OF DIABETIC COMPLICATIONS: * Take your prescribed MEDICATION(S) * Follow a DIABETIC diet: Complex carbs, fiber rich foods, (monounsaturated and polyunsaturated) fats * AVOID saturated/trans fats, high fat foods, >2,300 mg salt per day. * EXERCISE at least 5 times a week for 30 minutes or preferably daily.  * DO NOT SMOKE OR DRINK more than 1 drink a day. * Check your FEET every day. Do not wear tightfitting shoes. Contact us if you develop an ulcer * See your EYE doctor once a year or more if needed * Get a FLU shot once a year * Get a PNEUMONIA vaccine once before and once after age 21 years  GOALS:  * Your Hemoglobin A1c of <7%  * fasting sugars need to be <130 * after meals sugars need to be <180 (2h after you start eating) * Your Systolic BP should be 809 or lower  * Your Diastolic BP should be 80 or lower  * Your HDL (Good Cholesterol) should be 40 or higher  * Your LDL (Bad Cholesterol) should be 100 or lower. * Your Triglycerides should be 150 or lower  * Your Urine microalbumin (kidney function) should be <30 * Your Body Mass Index should be 25 or lower    Please consider the following ways to cut down carbs and fat and increase fiber and micronutrients in your diet: - substitute whole grain for white bread or pasta - substitute brown rice for white rice - substitute 90-calorie flat bread pieces for slices of bread when possible -  substitute sweet potatoes or yams for white potatoes - substitute humus for margarine - substitute tofu for cheese when possible - substitute almond or rice milk for regular milk (would not drink soy milk daily due to concern for soy estrogen influence on breast cancer risk) - substitute dark chocolate for other sweets when possible - substitute water - can add lemon or orange slices for taste - for diet sodas (artificial sweeteners will trick your body that you can eat sweets  without getting calories and will lead you to overeating and weight gain in the long run) - do not skip breakfast or other meals (this will slow down the metabolism and will result in more weight gain over time)  - can try smoothies made from fruit and almond/rice milk in am instead of regular breakfast - can also try old-fashioned (not instant) oatmeal made with almond/rice milk in am - order the dressing on the side when eating salad at a restaurant (pour less than half of the dressing on the salad) - eat as little meat as possible - can try juicing, but should not forget that juicing will get rid of the fiber, so would alternate with eating raw veg./fruits or drinking smoothies - use as little oil as possible, even when using olive oil - can dress a salad with a mix of balsamic vinegar and lemon juice, for e.g. - use agave nectar, stevia sugar, or regular sugar rather than artificial sweateners - steam or broil/roast veggies  - snack on veggies/fruit/nuts (unsalted, preferably) when possible, rather than processed foods - reduce or eliminate aspartame in diet (it is in diet sodas, chewing gum, etc) Read the labels!  Try to read Dr. Janene Harvey book: "Program for Reversing Diabetes" for other ideas for healthy eating.

## 2018-09-23 MED FILL — !LANTUS SOLOSTAR 100UNITS/M: 100 | 30 days supply | Qty: 9 | Fill #1

## 2018-09-23 MED FILL — !NOVOLOG FLEXPEN SYRINGE 1: 100/ML | 28 days supply | Qty: 9 | Fill #2

## 2018-10-09 ENCOUNTER — Other Ambulatory Visit: Payer: Self-pay | Admitting: Family Medicine

## 2018-10-09 ENCOUNTER — Other Ambulatory Visit: Payer: Self-pay | Admitting: Pharmacist

## 2018-10-09 DIAGNOSIS — J3089 Other allergic rhinitis: Secondary | ICD-10-CM

## 2018-10-09 MED ORDER — ATORVASTATIN CALCIUM 20 MG PO TABS: 20.0000 mg | ORAL_TABLET | Freq: Every day | ORAL | 2 refills | Status: DC

## 2018-10-09 MED FILL — FLUTICASONE PROP 50 MCG SPR: 50 | 30 days supply | Qty: 16 | Fill #0

## 2018-10-09 MED FILL — ?AMLODIPINE BESYLATE 10 MG: 10 | 30 days supply | Qty: 30 | Fill #1

## 2018-10-09 MED FILL — LOSARTAN POTASSIUM 100 MG T: 100 | 30 days supply | Qty: 30 | Fill #1

## 2018-10-09 MED FILL — ?ATORVASTATIN 20 MG TABLET: 20 | 30 days supply | Qty: 30 | Fill #0

## 2018-11-12 MED FILL — ?ATORVASTATIN 20 MG TABLET: 20 | 30 days supply | Qty: 30 | Fill #1

## 2018-11-12 MED FILL — LOSARTAN POTASSIUM 100 MG T: 100 | 30 days supply | Qty: 30 | Fill #2

## 2018-11-12 MED FILL — ?AMLODIPINE BESYLATE 10 MG: 10 | 30 days supply | Qty: 30 | Fill #2

## 2018-11-12 MED FILL — !NOVOLOG FLEXPEN SYRINGE 1: 100/ML | 28 days supply | Qty: 9 | Fill #3

## 2018-11-12 MED FILL — !LANTUS SOLOSTAR 100UNITS/M: 100 | 30 days supply | Qty: 9 | Fill #2

## 2019-01-28 ENCOUNTER — Other Ambulatory Visit: Payer: Self-pay | Admitting: Family Medicine

## 2019-01-28 DIAGNOSIS — I10 Essential (primary) hypertension: Secondary | ICD-10-CM

## 2019-01-28 MED FILL — !LANTUS SOLOSTAR 100UNITS/M: 100 | 30 days supply | Qty: 9 | Fill #3

## 2019-01-28 MED FILL — ?ATORVASTATIN 20 MG TABLET: 20 | 30 days supply | Qty: 30 | Fill #2

## 2019-01-28 MED FILL — AMLODIPINE BESYLATE 10 MG T: 10 | 30 days supply | Qty: 30 | Fill #0

## 2019-01-28 MED FILL — LOSARTAN POTASSIUM 100 MG T: 100 | 30 days supply | Qty: 30 | Fill #0

## 2019-01-28 MED FILL — FLUTICASONE PROP 50 MCG SPR: 50 | 30 days supply | Qty: 16 | Fill #1

## 2019-01-28 MED FILL — !NOVOLOG FLEXPEN SYRINGE 1: 100/ML | 28 days supply | Qty: 9 | Fill #4

## 2019-02-25 ENCOUNTER — Other Ambulatory Visit: Payer: Self-pay

## 2019-03-17 ENCOUNTER — Other Ambulatory Visit: Payer: Self-pay | Admitting: Family Medicine

## 2019-03-17 ENCOUNTER — Other Ambulatory Visit: Payer: Self-pay | Admitting: Pharmacist

## 2019-03-17 DIAGNOSIS — I10 Essential (primary) hypertension: Secondary | ICD-10-CM

## 2019-03-17 MED ORDER — TRUEPLUS 5-BEVEL PEN NEEDLES 32G X 4 MM MISC: 11 refills | Status: DC

## 2019-03-17 MED FILL — TRUEPLUS 5-BEVEL PEN NEEDLE: 31G X 5 MM | 25 days supply | Qty: 100 | Fill #0

## 2019-03-18 MED FILL — ATORVASTATIN CALCIUM 20 MG: 20 | 30 days supply | Qty: 30 | Fill #0

## 2019-04-06 ENCOUNTER — Telehealth: Payer: Self-pay | Admitting: Primary Care

## 2019-04-06 ENCOUNTER — Other Ambulatory Visit: Payer: Self-pay | Admitting: Pharmacist

## 2019-04-06 ENCOUNTER — Ambulatory Visit (HOSPITAL_BASED_OUTPATIENT_CLINIC_OR_DEPARTMENT_OTHER): Payer: Self-pay | Admitting: Pharmacist

## 2019-04-06 ENCOUNTER — Encounter: Payer: Self-pay | Admitting: Family

## 2019-04-06 ENCOUNTER — Other Ambulatory Visit: Payer: Self-pay

## 2019-04-06 ENCOUNTER — Ambulatory Visit: Payer: Self-pay | Attending: Family | Admitting: Family

## 2019-04-06 VITALS — BP 143/94 | HR 84 | Temp 98.0°F | Resp 16 | Ht 71.0 in | Wt 260.0 lb

## 2019-04-06 DIAGNOSIS — M1A09X0 Idiopathic chronic gout, multiple sites, without tophus (tophi): Secondary | ICD-10-CM

## 2019-04-06 DIAGNOSIS — E7800 Pure hypercholesterolemia, unspecified: Secondary | ICD-10-CM | POA: Insufficient documentation

## 2019-04-06 DIAGNOSIS — R079 Chest pain, unspecified: Secondary | ICD-10-CM | POA: Insufficient documentation

## 2019-04-06 DIAGNOSIS — Z79899 Other long term (current) drug therapy: Secondary | ICD-10-CM | POA: Insufficient documentation

## 2019-04-06 DIAGNOSIS — Z760 Encounter for issue of repeat prescription: Secondary | ICD-10-CM | POA: Insufficient documentation

## 2019-04-06 DIAGNOSIS — Z794 Long term (current) use of insulin: Secondary | ICD-10-CM | POA: Insufficient documentation

## 2019-04-06 DIAGNOSIS — M109 Gout, unspecified: Secondary | ICD-10-CM | POA: Insufficient documentation

## 2019-04-06 DIAGNOSIS — N521 Erectile dysfunction due to diseases classified elsewhere: Secondary | ICD-10-CM

## 2019-04-06 DIAGNOSIS — E119 Type 2 diabetes mellitus without complications: Secondary | ICD-10-CM | POA: Insufficient documentation

## 2019-04-06 DIAGNOSIS — Z888 Allergy status to other drugs, medicaments and biological substances status: Secondary | ICD-10-CM | POA: Insufficient documentation

## 2019-04-06 DIAGNOSIS — Z23 Encounter for immunization: Secondary | ICD-10-CM

## 2019-04-06 DIAGNOSIS — G4730 Sleep apnea, unspecified: Secondary | ICD-10-CM | POA: Insufficient documentation

## 2019-04-06 DIAGNOSIS — I10 Essential (primary) hypertension: Secondary | ICD-10-CM

## 2019-04-06 DIAGNOSIS — Z8249 Family history of ischemic heart disease and other diseases of the circulatory system: Secondary | ICD-10-CM | POA: Insufficient documentation

## 2019-04-06 DIAGNOSIS — Z87891 Personal history of nicotine dependence: Secondary | ICD-10-CM | POA: Insufficient documentation

## 2019-04-06 DIAGNOSIS — N529 Male erectile dysfunction, unspecified: Secondary | ICD-10-CM | POA: Insufficient documentation

## 2019-04-06 DIAGNOSIS — E785 Hyperlipidemia, unspecified: Secondary | ICD-10-CM | POA: Insufficient documentation

## 2019-04-06 DIAGNOSIS — Z833 Family history of diabetes mellitus: Secondary | ICD-10-CM | POA: Insufficient documentation

## 2019-04-06 DIAGNOSIS — E782 Mixed hyperlipidemia: Secondary | ICD-10-CM

## 2019-04-06 LAB — POCT GLYCOSYLATED HEMOGLOBIN (HGB A1C): HbA1c, POC (prediabetic range): 6 % (ref 5.7–6.4)

## 2019-04-06 LAB — GLUCOSE, POCT (MANUAL RESULT ENTRY): POC Glucose: 121 mg/dl — AB (ref 70–99)

## 2019-04-06 MED ORDER — ATORVASTATIN CALCIUM 20 MG PO TABS: 20.0000 mg | ORAL_TABLET | Freq: Every day | ORAL | 0 refills | Status: DC

## 2019-04-06 MED ORDER — AMLODIPINE BESYLATE 10 MG PO TABS: 10.0000 mg | ORAL_TABLET | Freq: Every day | ORAL | 0 refills | Status: DC

## 2019-04-06 MED ORDER — LOSARTAN POTASSIUM 100 MG PO TABS: 100.0000 mg | ORAL_TABLET | Freq: Every day | ORAL | 0 refills | Status: DC

## 2019-04-06 MED ORDER — INSULIN ASPART 100 UNIT/ML ~~LOC~~ SOLN: 8.0000 [IU] | Freq: Three times a day (TID) | SUBCUTANEOUS | 2 refills | Status: DC

## 2019-04-06 MED ORDER — LOSARTAN POTASSIUM 100 MG PO TABS: 100.0000 mg | ORAL_TABLET | Freq: Every day | ORAL | 2 refills | Status: DC

## 2019-04-06 MED ORDER — ATORVASTATIN CALCIUM 20 MG PO TABS: 20.0000 mg | ORAL_TABLET | Freq: Every day | ORAL | 2 refills | Status: DC

## 2019-04-06 MED ORDER — INSULIN ASPART 100 UNIT/ML ~~LOC~~ SOLN: 8.0000 [IU] | Freq: Three times a day (TID) | SUBCUTANEOUS | 3 refills | Status: DC

## 2019-04-06 MED ORDER — INSULIN ASPART 100 UNIT/ML ~~LOC~~ SOLN: 0.0000 [IU] | Freq: Three times a day (TID) | SUBCUTANEOUS | 2 refills | Status: DC

## 2019-04-06 MED ORDER — COLCHICINE 0.6 MG PO TABS: 0.6000 mg | ORAL_TABLET | Freq: Two times a day (BID) | ORAL | 0 refills | Status: AC | PRN

## 2019-04-06 MED ORDER — INSULIN GLARGINE 100 UNIT/ML ~~LOC~~ SOLN: 30.0000 [IU] | Freq: Every day | SUBCUTANEOUS | 3 refills | Status: DC

## 2019-04-06 MED ORDER — ASPIRIN EC 81 MG PO TBEC: 81.0000 mg | DELAYED_RELEASE_TABLET | Freq: Every day | ORAL | 2 refills | Status: AC

## 2019-04-06 MED ORDER — AMLODIPINE BESYLATE 10 MG PO TABS: 10.0000 mg | ORAL_TABLET | Freq: Every day | ORAL | 2 refills | Status: AC

## 2019-04-06 NOTE — Patient Instructions (Signed)
Return in 1 week for blood pressure check with clinical pharmacist. Return in 1 month for follow-up with primary physician for management of chronic conditions.   Hypertension, Adult Hypertension is another name for high blood pressure. High blood pressure forces your heart to work harder to pump blood. This can cause problems over time. There are two numbers in a blood pressure reading. There is a top number (systolic) over a bottom number (diastolic). It is best to have a blood pressure that is below 120/80. Healthy choices can help lower your blood pressure, or you may need medicine to help lower it. What are the causes? The cause of this condition is not known. Some conditions may be related to high blood pressure. What increases the risk?  Smoking.  Having type 2 diabetes mellitus, high cholesterol, or both.  Not getting enough exercise or physical activity.  Being overweight.  Having too much fat, sugar, calories, or salt (sodium) in your diet.  Drinking too much alcohol.  Having long-term (chronic) kidney disease.  Having a family history of high blood pressure.  Age. Risk increases with age.  Race. You may be at higher risk if you are African American.  Gender. Men are at higher risk than women before age 33. After age 26, women are at higher risk than men.  Having obstructive sleep apnea.  Stress. What are the signs or symptoms?  High blood pressure may not cause symptoms. Very high blood pressure (hypertensive crisis) may cause: ? Headache. ? Feelings of worry or nervousness (anxiety). ? Shortness of breath. ? Nosebleed. ? A feeling of being sick to your stomach (nausea). ? Throwing up (vomiting). ? Changes in how you see. ? Very bad chest pain. ? Seizures. How is this treated?  This condition is treated by making healthy lifestyle changes, such as: ? Eating healthy foods. ? Exercising more. ? Drinking less alcohol.  Your health care provider may  prescribe medicine if lifestyle changes are not enough to get your blood pressure under control, and if: ? Your top number is above 130. ? Your bottom number is above 80.  Your personal target blood pressure may vary. Follow these instructions at home: Eating and drinking   If told, follow the DASH eating plan. To follow this plan: ? Fill one half of your plate at each meal with fruits and vegetables. ? Fill one fourth of your plate at each meal with whole grains. Whole grains include whole-wheat pasta, brown rice, and whole-grain bread. ? Eat or drink low-fat dairy products, such as skim milk or low-fat yogurt. ? Fill one fourth of your plate at each meal with low-fat (lean) proteins. Low-fat proteins include fish, chicken without skin, eggs, beans, and tofu. ? Avoid fatty meat, cured and processed meat, or chicken with skin. ? Avoid pre-made or processed food.  Eat less than 1,500 mg of salt each day.  Do not drink alcohol if: ? Your doctor tells you not to drink. ? You are pregnant, may be pregnant, or are planning to become pregnant.  If you drink alcohol: ? Limit how much you use to:  0-1 drink a day for women.  0-2 drinks a day for men. ? Be aware of how much alcohol is in your drink. In the [place] one drink equals one 12 oz bottle of beer (355 mL), one 5 oz glass of wine (148 mL), or one 1 oz glass of hard liquor (44 mL). Lifestyle   Work with your doctor to stay  at a healthy weight or to lose weight. Ask your doctor what the best weight is for you.  Get at least 30 minutes of exercise most days of the week. This may include walking, swimming, or biking.  Get at least 30 minutes of exercise that strengthens your muscles (resistance exercise) at least 3 days a week. This may include lifting weights or doing Pilates.  Do not use any products that contain nicotine or tobacco, such as cigarettes, e-cigarettes, and chewing tobacco. If you need help quitting, ask your  doctor.  Check your blood pressure at home as told by your doctor.  Keep all follow-up visits as told by your doctor. This is important. Medicines  Take over-the-counter and prescription medicines only as told by your doctor. Follow directions carefully.  Do not skip doses of blood pressure medicine. The medicine does not work as well if you skip doses. Skipping doses also puts you at risk for problems.  Ask your doctor about side effects or reactions to medicines that you should watch for. Contact a doctor if you:  Think you are having a reaction to the medicine you are taking.  Have headaches that keep coming back (recurring).  Feel dizzy.  Have swelling in your ankles.  Have trouble with your vision. Get help right away if you:  Get a very bad headache.  Start to feel mixed up (confused).  Feel weak or numb.  Feel faint.  Have very bad pain in your: ? Chest. ? Belly (abdomen).  Throw up more than once.  Have trouble breathing. Summary  Hypertension is another name for high blood pressure.  High blood pressure forces your heart to work harder to pump blood.  For most people, a normal blood pressure is less than 120/80.  Making healthy choices can help lower blood pressure. If your blood pressure does not get lower with healthy choices, you may need to take medicine. This information is not intended to replace advice given to you by your health care provider. Make sure you discuss any questions you have with your health care provider. Document Revised: 11/06/2017 Document Reviewed: 11/06/2017 Elsevier Patient Education  2020 [employer].

## 2019-04-06 NOTE — Progress Notes (Signed)
DM f /u  C/o R UQ abdominal pain to the side on and off X 1 wk. Denies any pain at this time

## 2019-04-06 NOTE — Progress Notes (Addendum)
Patient ID: Justin Liu., male    DOB: 1975/08/18  MRN: [id number]  CC: Medication Refill  Subjective: Justin Liu is a 44 y.o. male who presents for medication refill.   1. HIGH BLOOD PRESSURE:  Currently taking: see medication list Med Adherence: [x]  Yes    []  No Medication side effects: []  Yes    [x]  No  Adherence with salt restriction: [x]  Yes    [x]  No Home Monitoring?: [x]  Yes    []  No Monitoring Frequency: [x]  Yes    []  No Home BP results range: [x]  Yes    []  No SOB? []  Yes    [x]  No Chest Pain?: [x]  Yes    []  No Leg swelling?: []  Yes    [x]  No Headaches?: []  Yes    [x]  No Dizziness? []  Yes    [x]  No Comments:  Ran out blood pressure medications last week. Prior to running out of medication has been taking blood pressure medication daily and consistently. Readings at home on average are 143/86. Monitors blood pressure at least 3 times per week. Chest pain began at least a year ago. Chest pain comes and goes. Chest pain happens at least two times per week. Chest pain described as a tight feeling. Denies heartburn and burning chest sensation. Has not had stress test in 3 years. Admits sharp pain at right flank. Nothing helps sharp right side flank pain. Right side flank pain has happened at least 7 times in the last year.  Denies smoking. Exercises daily. Has low-salt and low-fat diet.  2. HIGH CHOLESTEROL: Last Lipid Panel results:  HDL  Date Value Ref Range Status  08/01/2018 25 (L) >40 mg/dL Final  27 (L) >39 mg/dL Final   Triglycerides  Date Value Ref Range Status  08/01/2018 154 (H) <150 mg/dL Final    Med Adherence: [x]  Yes    []  No Medication side effects: [x]  Yes    []  No Muscle aches:  [x]  Yes    []  No Diet Adherence: [x]  Yes    []  No Comments:  Reports right shoulder muscle soreness. For a short time he quit taking the atorvastatin related to muscle soreness. After stopping medication the muscle soreness went away. Since then has  resumed taking atorvastatin. Uses Icy Hot and heating pads which has been useful.  3. DIABETES:   Last A1C:   Results for orders placed or performed in visit on 08/13/18  Novel Coronavirus, NAA ([facility])  Result Value Ref Range   SARS-CoV-2, NAA Not Detected Not Detected    Med Adherence:  [x]  Yes    []  No Medication side effects:  [x]  Yes    []  No Home Monitoring?  [x]  Yes    []  No Home glucose results range:130 Diet Adherence: []  Yes    [x]  No Exercise: [x]  Yes    []  No Hypoglycemic episodes?: []  Yes    [x]  No Numbness of the feet? []  Yes    [x]  No Retinopathy hx? []  Yes    [x]  No   Last eye exam:  July 2020 Comments: Decreased erection interested in medication for treatment. Denies white bread, potatoes, and sweets. Exercises daily, 1.5 hours daily high intensity workout plus 1 hour walking 2.5 miles daily.  4. GOUT:  Reports last gout attack was 2 weeks ago. During that time he took Advil which did not help. Also, took Colchicine (from a family member) during that time which helped. States that heat and ice help sometimes.  Reports location of gout at right ankle and bilateral hands. Currently symptoms of gout are not present. Admits changing diet but still has gout attacks occasionally. Denies joint inflammation. Denies joint tenderness. Denies joint swelling. Denies joint pain. Reports it has been at least 1 year since last being prescribed Colchicine related to financial aspects.     Patient Active Problem List   Diagnosis Date Noted  . Hospital discharge follow-up 08/24/2018  . DKA (diabetic ketoacidoses) (HCC) 08/01/2018  . New onset type 2 diabetes mellitus (HCC) 08/01/2018  . Dehydration 08/01/2018  . Asthma 07/06/2016  . Seasonal allergies 07/06/2016  . Sleep apnea 02/29/2016  . Gout 02/10/2015  . Nail bed injury 07/30/2014  . Essential hypertension 05/31/2014  . Family history of diabetes mellitus (DM) 05/31/2014  . Blurry vision 05/31/2014  . Accelerated  hypertension 05/05/2014     Current Outpatient Medications on File Prior to Visit  Medication Sig Dispense Refill  . acetaminophen (TYLENOL) 500 MG tablet Take 1 tablet (500 mg total) by mouth every 8 (eight) hours as needed (pain). 30 tablet 0  . albuterol (PROVENTIL HFA;VENTOLIN HFA) 108 (90 Base) MCG/ACT inhaler Inhale 2 puffs into the lungs every 4 (four) hours as needed for wheezing or shortness of breath. 1 Inhaler 3  . amLODipine (NORVASC) 10 MG tablet Take 1 tablet (10 mg total) by mouth daily. Must have office visit for refills 30 tablet 0  . atorvastatin (LIPITOR) 20 MG tablet TAKE 1 TABLET (20 MG TOTAL) BY MOUTH AT BEDTIME. 30 tablet 3  . cetirizine (ZYRTEC) 10 MG tablet Take 1 tablet (10 mg total) by mouth daily. 30 tablet 3  . colchicine 0.6 MG tablet Take 1 tablet (0.6 mg total) by mouth 2 (two) times daily as needed for up to 7 days (gout pain).    . fluticasone (FLONASE) 50 MCG/ACT nasal spray PLACE 2 SPRAYS INTO BOTH NOSTRILS DAILY. 16 g 2  . insulin aspart (NOVOLOG) 100 UNIT/ML injection Inject 8 Units into the skin 3 (three) times daily with meals. 10 mL 3  . insulin aspart (NOVOLOG) 100 UNIT/ML injection Inject 0-9 Units into the skin 3 (three) times daily with meals. CORRECTION FACTOR Sliding scale CBG 70 - 120: 0 units CBG 121 - 150: 1 unit,  CBG 151 - 200: 2 units,  CBG 201 - 250: 3 units,  CBG 251 - 300: 5 units,  CBG 301 - 350: 7 units,  CBG 351 - 400: 9 units   CBG > 400: 9 units and notify your MD 10 mL 3  . insulin glargine (LANTUS) 100 UNIT/ML injection Inject 0.3 mLs (30 Units total) into the skin daily. 10 mL 3  . Insulin Pen Needle (TRUEPLUS 5-BEVEL PEN NEEDLES) 32G X 4 MM MISC Use as directed to inject insulin daily. 100 each 11  . Insulin Syringes, Disposable, U-100 0.5 ML MISC 0-9 Units by Does not apply route 3 (three) times daily with meals. 100 each 3  . losartan (COZAAR) 100 MG tablet Take 1 tablet (100 mg total) by mouth daily. Must have office visit for  refills 30 tablet 0   No current facility-administered medications on file prior to visit.    Allergies  Allergen Reactions  . Watermelon [Citrullus Vulgaris] Anaphylaxis    Patient states he has eaten watermelon recently with no reaction and believes he has grown out of it.  . Lisinopril Hives    Swollen gums    Social History   Socioeconomic History  . Marital status:  Married    Spouse name: Not on file  . Number of children: Not on file  . Years of education: Not on file  . Highest education level: Not on file  Occupational History  . Not on file  Tobacco Use  . Smoking status: Former Smoker    Years: 10.00    Quit date: 04/29/2008    Years since quitting: 10.9  . Smokeless tobacco: Never Used  . Tobacco comment: quit 3 years ago  Substance and Sexual Activity  . Alcohol use: No  . Drug use: No  . Sexual activity: Not on file  Other Topics Concern  . Not on file  Social History Narrative  . Not on file   Social Determinants of Health   Financial Resource Strain:   . Difficulty of Paying Living Expenses: Not on file  Food Insecurity:   . Worried About Programme researcher, broadcasting/film/videounning Out of Food in the Last Year: Not on file  . Ran Out of Food in the Last Year: Not on file  Transportation Needs:   . Lack of Transportation (Medical): Not on file  . Lack of Transportation (Non-Medical): Not on file  Physical Activity:   . Days of Exercise per Week: Not on file  . Minutes of Exercise per Session: Not on file  Stress:   . Feeling of Stress : Not on file  Social Connections:   . Frequency of Communication with Friends and Family: Not on file  . Frequency of Social Gatherings with Friends and Family: Not on file  . Attends Religious Services: Not on file  . Active Member of Clubs or Organizations: Not on file  . Attends BankerClub or Organization Meetings: Not on file  . Marital Status: Not on file  Intimate Partner Violence:   . Fear of Current or Ex-Partner: Not on file  . Emotionally  Abused: Not on file  . Physically Abused: Not on file  . Sexually Abused: Not on file    Family History  Problem Relation Age of Onset  . Hypertension Mother   . Stroke Mother   . Hypertension Father   . Diabetes Father   . Heart disease Father   . Heart attack Paternal Uncle   . Heart attack Paternal Grandmother   . Heart disease Paternal Grandmother   . Heart attack Paternal Grandfather   . Heart disease Paternal Grandfather     No past surgical history on file.   ROS: Review of Systems  Constitutional: Negative for chills and fever.  HENT: Negative for sore throat.   Respiratory: Negative for cough, shortness of breath and wheezing.   Cardiovascular: Positive for chest pain. Negative for palpitations and leg swelling.  Gastrointestinal: Negative for abdominal pain, heartburn, nausea and vomiting.  Musculoskeletal: Positive for myalgias.  Neurological: Negative for dizziness, tingling, tremors, sensory change, speech change and headaches.    Negative except as stated above  PHYSICAL EXAM: There were no vitals taken for this visit.  Physical Exam General appearance - alert, well appearing, and in no distress and oriented to person, place, and time Mental status - alert, oriented to person, place, and time, normal mood, behavior, speech, dress, motor activity, and thought processes Chest - clear to auscultation, no wheezes, rales or rhonchi, symmetric air entry, no tachypnea, retractions or cyanosis Heart - normal rate, regular rhythm, normal S1, S2, no murmurs, rubs, clicks or gallops, normal rate and regular rhythm, S1 and S2 normal, no murmurs noted, no gallops noted Back exam - full range  of motion, no tenderness, palpable spasm or pain on motion Neurological - alert, oriented, normal speech, no focal findings or movement disorder noted, screening mental status exam normal, neck supple without rigidity, cranial nerves II through XII intact, DTR's normal and symmetric,  motor and sensory grossly normal bilaterally, normal muscle tone, no tremors, strength 5/5 Extremities - peripheral pulses normal, no pedal edema, no clubbing or cyanosis  CMP Latest Ref Rng & Units 08/02/2018 08/01/2018 08/01/2018  Glucose 70 - 99 mg/dL 409(W) 119(J) 478(G)  BUN 6 - 20 mg/dL 9 8 15   Creatinine 0.61 - 1.24 mg/dL 9.56 2.13  Sodium 135 - 145 mmol/L 135 139 139  Potassium 3.5 - 5.1 mmol/L 3.0(L) 3.2(L) 5.5(H)  Chloride 98 - 111 mmol/L 100 102 103  CO2 22 - 32 mmol/L 24 25 20(L)  Calcium 8.9 - 10.3 mg/dL 9.1 9.4 9.4  Total Protein 6.0 - 8.5 g/dL - - -  Total Bilirubin 0.0 - 1.2 mg/dL - - -  Alkaline Phos 39 - 117 IU/L - - -  AST 0 - 40 IU/L - - -  ALT 0 - 44 IU/L - - -   Lipid Panel     Component Value Date/Time   CHOL 215 (H) 08/01/2018 0045   CHOL 162 07/06/2016 1023   TRIG 154 (H) 08/01/2018 0045   HDL 25 (L) 08/01/2018 0045   HDL 27 (L) 07/06/2016 1023   CHOLHDL 8.6 08/01/2018 0045   VLDL 31 08/01/2018 0045   LDLCALC 159 (H) 08/01/2018 0045   LDLCALC 105 (H) 07/06/2016 1023    CBC    Component Value Date/Time   WBC 7.7 08/01/2018 0045   RBC 4.91 08/01/2018 0045   HGB 15.3 08/01/2018 0045   HGB 14.6 10/16/2013 2029   HCT 44.8 08/01/2018 0045   HCT 43.9 10/16/2013 2029   PLT 280 08/01/2018 0045   PLT 241 10/16/2013 2029   MCV 91.2 08/01/2018 0045   MCV 95 10/16/2013 2029   MCH 31.2 08/01/2018 0045   MCHC 34.2 08/01/2018 0045   RDW 13.0 08/01/2018 0045   RDW 14.9 (H) 10/16/2013 2029   LYMPHSABS 2.6 08/01/2018 0045   MONOABS 0.5 08/01/2018 0045   EOSABS 0.0 08/01/2018 0045   BASOSABS 0.0 08/01/2018 0045    ASSESSMENT AND PLAN: 1. HYPERTENSION:  -Take amlodipine (NORVASC); Take 1 tablet (10 mg total) by mouth daily -Take losartan (COZAAR); Take 1 tablet (100 mg total) by mouth daily -Return in 1 week for blood pressure check with clinical pharmacist. Take blood pressure medication prior to appointment. Please bring blood pressure readings and  home blood pressure monitor to appointment.  -Follow up in 1 month with Dr. 08/03/2018 for management of chronic conditions -Patient have been counseled extensively about nutrition and exercise. Other issues discussed during this visit include: low cholesterol diet, weight control and daily exercise, importance of adherence with medications and regular follow-up. We also discussed long term complications of uncontrolled hypertension.  -Discussed the importance of healthy eating habits, regular aerobic exercise (at least 150 minutes a week as tolerated) and medication compliance to achieve or maintain control of diabetes. . Follow a Healthy Eating Plan - You can do it! . Limit sugary drinks.  Avoid sodas, sweet tea, sport or energy drinks, or fruit drinks.  Drink water, lo-fat milk, or diet drinks. . Limit snack foods.   Cut back on candy, cake, cookies, chips, ice cream.  These are a special treat, only in small amounts. . Eat plenty of  vegetables.  Especially dark green, red, and orange vegetables. Aim for at least 3 servings a day. More is better!  Include fruit in your daily diet.  Whole fruit is much healthier than fruit juice! . Limit "white" bread, "white" pasta, "white" rice.   Choose "100% whole grain" products, brown or wild rice. Marland Kitchen Avoid fatty meats. Try "Meatless Monday" and choose eggs or beans one day a week.  When eating meat, choose lean meats like chicken, [place], and fish.  Grill, broil, or bake meats instead of frying, and eat poultry without the skin. . Eat less salt.  Avoid frozen pizzas, frozen dinners and salty foods.  Use seasonings other than salt in cooking.  This can help blood pressure and keep you from swelling . Beer, wine and liquor have calories.  If you can safely drink alcohol, limit to 1 drink per day for women, 2 drinks for men  2. DIABETES MELLITUS TYPE 2:   -Take insulin aspart (NOVOLOG); Inject 8 Units into the skin 3 (three) times daily with meals. -Take insulin  aspart (NOVOLOG); Inject 0-9 Units into the skin 3 (three) times daily with meals. CORRECTION FACTOR Sliding scale CBG 70 - 120: 0 units CBG 121 - 150: 1 unit, CBG 151 - 200: 2 units, CBG 201 - 250: 3 units, CBG 251 - 300: 5 units, CBG 301 - 350: 7 units, CBG 351 - 400: 9 units CBG > 400: 9 units and notify your MD - Take insulin glargine (LANTUS); Inject 0.3 mLs (30 Units total) into the skin daily -Use Insulin Pen Needle (TRUEPLUS 5-BEVEL PEN NEEDLES); Use as directed to inject insulin daily -Check glucose readings at home before each meal and at bedtime - Hemoglobin A1C lab drawn today -Glucose reading at point of care collected today -Microalbumin and creatinine urine ratio collected today in lab, results pending -Discussed the importance of healthy eating habits, regular aerobic exercise (at least 150 minutes a week as tolerated) and medication compliance to achieve or maintain control of diabetes.  3. HYPERLIPIDEMIA:   -Take atorvastatin (LIPITOR); Take 1 tablet (20 mg total) by mouth at bedtime -Discussed the importance of healthy eating habits, regular aerobic exercise (at least 150 minutes a week as tolerated) and medication compliance to achieve or maintain control of hyperlipidemia  4. GOUT:  -Take colchicine; Take 1 tablet (0.6 mg total) by mouth 2 (two) times daily as needed for up to 7 days for gout pain; Dispense: 14 tablets; Refills: 0  5. CHEST PAIN:  -EKG completed in clinic -EKG results today are normal sinus rhythm with sinus arrhythmia with T wave abnormality. -Previous EKG reading in May 2020 with results very similar to today's reading. Therefore, the reading from today's result is interpreted as a chronic condition. -Referral to cardiology for further assessment and treatment -Take aspirin EC (81 mg total) daily by mouth for myocardial infarction prevention.; Dispense: 30 tablets; Refills: 2 -Brain natriuretic peptide collected today, results pending -Troponin I  collected today, results pending -Patient was given clear instructions to go to Emergency Department or return to medical center if symptoms don't improve, worsen, or new problems develop.The patient verbalized understanding.  6. ERECTILE DYSFUNCTION RELATED TO DISEASES CLASSIFIED ELSEWHERE: -Advised patient that medication for erectile dysfunction will be delayed until patient can see cardiologist for assessment and or treatment of chest pain. Patient expresses understanding.     Patient was given the opportunity to ask questions. Patient verbalized understanding of the plan and was able to repeat key elements  of the plan. Patient was given clear instructions to go to Emergency Department or return to medical center if symptoms don't improve, worsen, or new problems develop.The patient verbalized understanding.  No orders of the defined types were placed in this encounter.    Requested Prescriptions    No prescriptions requested or ordered in this encounter    No follow-ups on file.  Rema FendtAmy J Dashun Borre, NP

## 2019-04-06 NOTE — Progress Notes (Signed)
Patient presents for vaccination against influenza per orders of Amy. Consent given. Counseling provided. No contraindications exists. Vaccine administered without incident.

## 2019-04-07 LAB — TROPONIN I: Troponin I: 0.02 ng/mL (ref 0.00–0.04)

## 2019-04-07 LAB — MICROALBUMIN / CREATININE URINE RATIO
Creatinine, Urine: 261.5 mg/dL
Microalb/Creat Ratio: 14 mg/g creat (ref 0–29)
Microalbumin, Urine: 36.5 ug/mL

## 2019-04-07 LAB — BRAIN NATRIURETIC PEPTIDE: BNP: 13.7 pg/mL (ref 0.0–100.0)

## 2019-04-07 NOTE — Progress Notes (Signed)
Microalbumin/creatinine urine ratio results is 14 within normal limits (range 0-29 mg/g).  Brain natriuretic peptide results is 13.7 within normal limits (range 0.0-100.0 pg/ml).  Troponin I results is 0.02 within normal limits (range 0.00-0.04 ng/ml).

## 2019-04-08 ENCOUNTER — Telehealth: Payer: Self-pay

## 2019-04-08 NOTE — Telephone Encounter (Signed)
-----   Message from Rema Fendt, NP sent at 04/07/2019  5:08 PM EST ----- Microalbumin/creatinine urine ratio results is 14 within normal limits (range 0-29 mg/g).  Brain natriuretic peptide results is 13.7 within normal limits (range 0.0-100.0 pg/ml).  Troponin I results is 0.02 within normal limits (range 0.00-0.04 ng/ml).

## 2019-04-08 NOTE — Telephone Encounter (Signed)
Called patient today, results given as instructed. Verbalized understanding Reminded pt of his in person appoitment with Franky Macho at [facility] on 04/13/2019.

## 2019-04-13 ENCOUNTER — Ambulatory Visit: Payer: Self-pay | Admitting: Pharmacist

## 2019-04-13 NOTE — Progress Notes (Deleted)
   S:    Patient arrives ***.    Presents to the clinic for hypertension evaluation, counseling, and management.  Patient was referred and last seen by Primary Care Provider on ***.  Patient was referred on ***.  Patient was last seen by Primary Care Provider on ***.   Patient reports adherence with medications.  Current BP Medications include:  Amlodipine 10 mg daily, Losartan 100 mg daily   Antihypertensives tried in the past include:    Dietary habits include: *** Exercise habits include:*** Family / Social history: ***  ASCVD risk factors include:***  O:  Physical Exam   ROS  Home BP readings: ***  Last 3 Office BP readings: BP Readings from Last 3 Encounters:  04/06/19 (!) 143/94  08/02/18 (!) 135/96  06/29/18 (!) 138/105    BMET    Component Value Date/Time   NA 135 08/02/2018 0314   NA 141 07/06/2016 1023   NA 139 10/16/2013 2029   K 3.0 (L) 08/02/2018 0314   K 4.3 10/16/2013 2029   CL 100 08/02/2018 0314   CL 104 10/16/2013 2029   CO2 24 08/02/2018 0314   CO2 31 10/16/2013 2029   GLUCOSE 281 (H) 08/02/2018 0314   GLUCOSE 99 10/16/2013 2029   BUN 9 08/02/2018 0314   BUN 16 07/06/2016 1023   BUN 13 10/16/2013 2029   CREATININE 1.02 08/02/2018 0314   CREATININE 1.24 02/10/2015 0904   CALCIUM 9.1 08/02/2018 0314   CALCIUM 8.9 10/16/2013 2029   GFRNONAA >60 08/02/2018 0314   GFRNONAA 66 08/06/2014 1228   GFRAA >60 08/02/2018 0314   GFRAA 77 08/06/2014 1228   Renal function: CrCl cannot be calculated (Patient's most recent lab result is older than the maximum 21 days allowed.).  Clinical ASCVD: No  The 10-year ASCVD risk score Denman George DC Jr., et al., 2013) is: 18.1%   Values used to calculate the score:     Age: 44 years     Sex: Male     Is Non-Hispanic African American: Yes     Diabetic: Yes     Tobacco smoker: No     Systolic Blood Pressure: 143 mmHg     Is BP treated: Yes     HDL Cholesterol: 25 mg/dL     Total Cholesterol: 215  mg/dL   A/P: Hypertension longstanding/newly diagnosed currently *** on current medications. BP Goal = *** mmHg. Patient medications adherence ***.  -{Meds adjust:18428} ***.  -F/u labs ordered - *** -Counseled on lifestyle modifications for blood pressure control including reduced dietary sodium, increased exercise, adequate sleep  Results reviewed and written information provided.   Total time in face-to-face counseling *** minutes.   F/U Clinic Visit in ***.  Patient seen with ***

## 2019-04-14 ENCOUNTER — Ambulatory Visit (INDEPENDENT_AMBULATORY_CARE_PROVIDER_SITE_OTHER): Payer: 59 | Admitting: Internal Medicine

## 2019-04-14 ENCOUNTER — Encounter: Payer: Self-pay | Admitting: Internal Medicine

## 2019-04-14 ENCOUNTER — Other Ambulatory Visit: Payer: Self-pay

## 2019-04-14 VITALS — BP 142/88 | HR 72 | Temp 97.7°F | Ht 71.0 in | Wt 262.2 lb

## 2019-04-14 DIAGNOSIS — R400 Somnolence: Secondary | ICD-10-CM

## 2019-04-14 DIAGNOSIS — R072 Precordial pain: Secondary | ICD-10-CM

## 2019-04-14 DIAGNOSIS — I517 Cardiomegaly: Secondary | ICD-10-CM

## 2019-04-14 DIAGNOSIS — I10 Essential (primary) hypertension: Secondary | ICD-10-CM

## 2019-04-14 DIAGNOSIS — M303 Mucocutaneous lymph node syndrome [Kawasaki]: Secondary | ICD-10-CM | POA: Diagnosis not present

## 2019-04-14 DIAGNOSIS — R0683 Snoring: Secondary | ICD-10-CM

## 2019-04-14 DIAGNOSIS — G4730 Sleep apnea, unspecified: Secondary | ICD-10-CM

## 2019-04-14 MED ORDER — CARVEDILOL 3.125 MG PO TABS: 3.1250 mg | ORAL_TABLET | Freq: Two times a day (BID) | ORAL | 4 refills | Status: AC

## 2019-04-14 MED ORDER — METOPROLOL TARTRATE 50 MG PO TABS: 50.0000 mg | ORAL_TABLET | Freq: Once | ORAL | 0 refills | Status: AC

## 2019-04-14 NOTE — Patient Instructions (Addendum)
Medication Instructions:  START TAKING CARVEDILOL 3.125 MG ONE TABLET TWICE A DAY   DO NOT USE  COLCHICINE IF YOUR TAKING THE CARVEDILOL FOR LONG PERIODS AT A TIME   START TAKING CoQ10  300 MG  TAKE  DAILY  ( OVER THE COUNTER MEDICATIONS)   SEE INSTRUCTION SHEET FOR CCTA *If you need a refill on your cardiac medications before your next appointment, please call your pharmacy*  Lab Work: SEE INSTRUCTION FOR CCTA  If you have labs (blood work) drawn today and your tests are completely normal, you will receive your results only by: Marland Kitchen MyChart Message (if you have MyChart) OR . A paper copy in the mail If you have any lab test that is abnormal or we need to change your treatment, we will call you to review the results.  Testing/Procedures: Will be schedule at [facility] [address] 1-Your physician has requested that you have an echocardiogram. Echocardiography is a painless test that uses sound waves to create images of your heart. It provides your doctor with information about the size and shape of your heart and how well your heart's chambers and valves are working. This procedure takes approximately one hour. There are no restrictions for this procedure. AND  WILL BE SCHEDULE AT [facility] 2-Your physician has requested that you have cardiac CTA. Cardiac computed tomography (CT) is a painless test that uses an x-ray machine to take clear, detailed pictures of your heart. For further information please visit [email or web address] Please follow instruction sheet as given.   WILL BE SCHEDULE AT [facility] IF PROMPTED ONCE INSURANCE AUTHORIZED  3-Your physician has recommended that you have a sleep study. This test records several body functions during sleep, including: brain activity, eye movement, oxygen and carbon dioxide blood levels, heart rate and rhythm, breathing rate and rhythm, the flow of air through your mouth and nose, snoring, body muscle  movements, and chest and belly movement.    Follow-Up: At [facility], you and your health needs are our priority.  As part of our continuing mission to provide you with exceptional heart care, we have created designated Provider Care Teams.  These Care Teams include your primary Cardiologist (physician) and Advanced Practice Providers (APPs -  Physician Assistants and Nurse Practitioners) who all work together to provide you with the care you need, when you need it.  Your next appointment:   2 week(s)  The format for your next appointment:   In Person  Provider:   Weston Brass, MD  Other Instructions  N/A  Your cardiac CT will be scheduled at the below location:   [facility] [address] [place], [place] [address] [phone]   If scheduled at [facility], please arrive at the [facility] main entrance of [facility] 30-45 minutes prior to test start time. Proceed to the [facility] Radiology Department (first floor) to check-in and test prep.    Please follow these instructions carefully (unless otherwise directed):  Hold all erectile dysfunction medications at least 3 days (72 hrs) prior to test.   PLEASE HAVE LABS - BMP DONE PRIOR TO TEST - ONE WEEK  On the Night Before the Test: . Be sure to Drink plenty of water. . Do not consume any caffeinated/decaffeinated beverages or chocolate 12 hours prior to your test. . Do not take any antihistamines 12 hours prior to your test. .   On the Day of the Test: . Drink  plenty of water. Do not drink any water within one hour of the test. . Do not eat any food 4 hours prior to the test. . You may take your regular medications prior to the test.  . Take metoprolol (Lopressor) 50 MG  two hours prior to test.  TAKE YOUR REGULAR DOSE OF CARVEDILOL 3.125 MG  MORNING OF  TEST. . DO NOT TAKE YOUR INSULIN IF YOU DO NOT EAT THAT MORNING. Marland Kitchen HOLD Furosemide/Hydrochlorothiazide morning of the  test.   After the Test: . Drink plenty of water. . After receiving IV contrast, you may experience a mild flushed feeling. This is normal. . On occasion, you may experience a mild rash up to 24 hours after the test. This is not dangerous. If this occurs, you can take Benadryl 25 mg and increase your fluid intake. . If you experience trouble breathing, this can be serious. If it is severe call 911 IMMEDIATELY. If it is mild, please call our office.   Once we have confirmed authorization from your insurance company, we will call you to set up a date and time for your test.   For non-scheduling related questions, please contact the cardiac imaging nurse navigator should you have any questions/concerns: Rockwell Alexandria, RN Navigator Cardiac Imaging [facility] [phone] Office        Sleep Studies A sleep study (polysomnogram) is a series of tests done while you are sleeping. A sleep study records your brain waves, heart rate, breathing rate, oxygen level, and eye and leg movements. A sleep study helps your health care provider:  See how well you sleep.  Diagnose a sleep disorder.  Determine how severe your sleep disorder is.  Create a plan to treat your sleep disorder. Your health care provider may recommend a sleep study if you:  Feel sleepy on most days.  Snore loudly while sleeping.  Have unusual behaviors while you sleep, such as walking.  Have brief periods in which you stop breathing during sleep (sleepapnea).  Fall asleep suddenly during the day (narcolepsy).  Have trouble falling asleep or staying asleep (insomnia).  Feel like you need to move your legs when trying to fall asleep (restless legs syndrome).  Move your legs by flexing and extending them regularly while asleep (periodic limb movement disorder).  Act out your dreams while you sleep (sleep behavior disorder).  Feel like you cannot move when you first wake up (sleep  paralysis). What tests are part of a sleep study? Most sleep studies record the following during sleep:  Brain activity.  Eye movements.  Heart rate and rhythm.  Breathing rate and rhythm.  Blood-oxygen level.  Blood pressure.  Chest and belly movement as you breathe.  Arm and leg movements.  Snoring or other noises.  Body position. Where are sleep studies done? Sleep studies are done at sleep centers. A sleep center may be inside a hospital, office, or clinic. The room where you have the study may look like a hospital room or a hotel room. The health care providers doing the study may come in and out of the room during the study. Most of the time, they will be in another room monitoring your test as you sleep. How are sleep studies done? Most sleep studies are done during a normal period of time for a full night of sleep. You will arrive at the study center in the evening and go home in the morning. Before the test  Bring your pajamas  and toothbrush with you to the sleep study.  Do not have caffeine on the day of your sleep study.  Do not drink alcohol on the day of your sleep study.  Your health care provider will let you know if you should stop taking any of your regular medicines before the test. During the test      Round, sticky patches with sensors attached to recording wires (electrodes) are placed on your scalp, face, chest, and limbs.  Wires from all the electrodes and sensors run from your bed to a computer. The wires can be taken off and put back on if you need to get out of bed to go to the bathroom.  A sensor is placed over your nose to measure airflow.  A finger clip is put on your finger or ear to measure your blood oxygen level (pulse oximetry).  A belt is placed around your belly and a belt is placed around your chest to measure breathing movements.  If you have signs of the sleep disorder called sleep apnea during your test, you may get a  treatment mask to wear for the second half of the night. ? The mask provides positive airway pressure (PAP) to help you breathe better during sleep. This may greatly improve your sleep apnea. ? You will then have all tests done again with the mask in place to see if your measurements and recordings change. After the test  A medical doctor who specializes in sleep will evaluate the results of your sleep study and share them with you and your primary health care provider.  Based on your results, your medical history, and a physical exam, you may be diagnosed with a sleep disorder, such as: ? Sleep apnea. ? Restless legs syndrome. ? Sleep-related behavior disorder. ? Sleep-related movement disorders. ? Sleep-related seizure disorders.  Your health care team will help determine your treatment options based on your diagnosis. This may include: ? Improving your sleep habits (sleep hygiene). ? Wearing a continuous positive airway pressure (CPAP) or bi-level positive airway pressure (BPAP) mask. ? Wearing an oral device at night to improve breathing and reduce snoring. ? Taking medicines. Follow these instructions at home:  Take over-the-counter and prescription medicines only as told by your health care provider.  If you are instructed to use a CPAP or BPAP mask, make sure you use it nightly as directed.  Make any lifestyle changes that your health care provider recommends.  If you were given a device to open your airway while you sleep, use it only as told by your health care provider.  Do not use any tobacco products, such as cigarettes, chewing tobacco, and e-cigarettes. If you need help quitting, ask your health care provider.  Keep all follow-up visits as told by your health care provider. This is important. Summary  A sleep study (polysomnogram) is a series of tests done while you are sleeping. It shows how well you sleep.  Most sleep studies are done over one full night of sleep.  You will arrive at the study center in the evening and go home in the morning.  If you have signs of the sleep disorder called sleep apnea during your test, you may get a treatment mask to wear for the second half of the night.  A medical doctor who specializes in sleep will evaluate the results of your sleep study and share them with your primary health care provider. This information is not intended to replace advice given to  you by your health care provider. Make sure you discuss any questions you have with your health care provider. Document Revised: 08/13/2018 Document Reviewed: 03/26/2017 Else[employer] Patient Education  [place].

## 2019-04-14 NOTE — Progress Notes (Signed)
Cardiology Office Note:    Date:  04/14/2019   ID:  Justin Liu., DOB [birth year], MRN [id number]  PCP:  Kerin Perna, NP  Cardiologist:  No primary care provider on file.  Electrophysiologist:  None   Referring MD: Camillia Herter, NP   Chief Complaint: chest pain  History of Present Illness:    Justin Liu. is a 44 y.o. 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  The details of this he cannot recall and I am uncertain of what work-up he has had for diagnosis.  He tells me that he has "tremors" in his chest and in both arms if he holds his arms very still.  He also describes a heartburn in his chest that comes and goes.  Primarily on the right side but can occasionally be on the left side in a bandlike fashion across his chest.  It does not change with deep inspiration.  He denies dyspnea on exertion or palpitations.  He denies significant acid reflux symptoms and rarely takes Tums.  Family history is significant for his father who had a heart and kidney transplant.  He tells me his father had bypass surgery, this sounds like it could potentially be ischemic cardiomyopathy.  His father has early ischemic disease given that he is only 101 years older than the patient and this all occurred within the past 10 years.  Recently seen by primary care nurse practitioner, he has an unremarkable troponin and BNP obtained during that visit.  Denies lower extremity swelling, PND, orthopnea.  Patient has a history of obstructive sleep apnea and is not currently using his CPAP.  He finds it cumbersome.  He will need a repeat sleep study.  He is a former smoker.  He had a stress test performed in 2017 which  showed a reduced ejection fraction of 43%, no ischemia, and elevated blood pressure with exercise up to 194/106 mmHg at peak stress.  He exercised for 7 minutes and achieved 9.2 METS.  143/90 BP at home.  Walks around neighborhood with wife and dogs, is largely asymptomatic with this.  Diabetes - 6 HbA1c  Atorvastatin muscle soreness - shoulders  He tells me he was diagnosed with an irregular heartbeat as a child but has not had palpitations. Past Medical History:  Diagnosis Date  . Asthma   . Hypertension   . Irregular heartbeat     No past surgical history on file.  Current Medications: Current Meds  Medication Sig  . albuterol (PROVENTIL HFA;VENTOLIN HFA) 108 (90 Base) MCG/ACT inhaler Inhale 2 puffs into the lungs every 4 (four) hours as needed for wheezing or shortness of breath.  Marland Kitchen amLODipine (NORVASC) 10 MG tablet Take 1 tablet (10 mg total) by mouth daily.  Marland Kitchen aspirin EC 81 MG tablet Take 1 tablet (81 mg total) by mouth daily.  Marland Kitchen atorvastatin (LIPITOR) 20 MG tablet Take 1 tablet (20 mg total) by mouth at bedtime.  . cetirizine (ZYRTEC) 10 MG tablet Take 1 tablet (10 mg total) by mouth daily.  . fluticasone (FLONASE) 50 MCG/ACT nasal spray PLACE 2 SPRAYS INTO BOTH NOSTRILS DAILY.  Marland Kitchen insulin aspart (NOVOLOG) 100 UNIT/ML injection Inject 8 Units into the skin 3 (three) times daily with meals.  Marland Kitchen  insulin aspart (NOVOLOG) 100 UNIT/ML injection Inject 0-9 Units into the skin 3 (three) times daily with meals. CORRECTION FACTOR Sliding scale CBG 70 - 120: 0 units CBG 121 - 150: 1 unit,  CBG 151 - 200: 2 units,  CBG 201 - 250: 3 units,  CBG 251 - 300: 5 units,  CBG 301 - 350: 7 units,  CBG 351 - 400: 9 units   CBG > 400: 9 units and notify your MD  . insulin aspart (NOVOLOG) 100 UNIT/ML injection Inject 8 Units into the skin 3 (three) times daily with meals.  . insulin glargine (LANTUS) 100 UNIT/ML injection Inject 0.3 mLs (30 Units total) into the skin daily.  . Insulin Pen Needle  (TRUEPLUS 5-BEVEL PEN NEEDLES) 32G X 4 MM MISC Use as directed to inject insulin daily.  . Insulin Syringes, Disposable, U-100 0.5 ML MISC 0-9 Units by Does not apply route 3 (three) times daily with meals.  Marland Kitchen losartan (COZAAR) 100 MG tablet Take 1 tablet (100 mg total) by mouth daily. Must have office visit for refills  . losartan (COZAAR) 100 MG tablet Take 1 tablet (100 mg total) by mouth daily.     Allergies:   Watermelon [citrullus vulgaris] and Lisinopril   Social History   Socioeconomic History  . Marital status: Married    Spouse name: Not on file  . Number of children: Not on file  . Years of education: Not on file  . Highest education level: Not on file  Occupational History  . Not on file  Tobacco Use  . Smoking status: Former Smoker    Years: 10.00    Quit date: 04/29/2008    Years since quitting: 10.9  . Smokeless tobacco: Never Used  . Tobacco comment: quit 3 years ago  Substance and Sexual Activity  . Alcohol use: No  . Drug use: No  . Sexual activity: Not on file  Other Topics Concern  . Not on file  Social History Narrative  . Not on file   Social Determinants of Health   Financial Resource Strain:   . Difficulty of Paying Living Expenses: Not on file  Food Insecurity:   . Worried About Programme researcher, broadcasting/film/video in the Last Year: Not on file  . Ran Out of Food in the Last Year: Not on file  Transportation Needs:   . Lack of Transportation (Medical): Not on file  . Lack of Transportation (Non-Medical): Not on file  Physical Activity:   . Days of Exercise per Week: Not on file  . Minutes of Exercise per Session: Not on file  Stress:   . Feeling of Stress : Not on file  Social Connections:   . Frequency of Communication with Friends and Family: Not on file  . Frequency of Social Gatherings with Friends and Family: Not on file  . Attends Religious Services: Not on file  . Active Member of Clubs or Organizations: Not on file  . Attends Banker  Meetings: Not on file  . Marital Status: Not on file     Family History: The patient's family history includes Diabetes in his father; Heart attack in his paternal grandfather, paternal grandmother, and paternal uncle; Heart disease in his father, paternal grandfather, and paternal grandmother; Hypertension in his father and mother; Stroke in his mother.  ROS:   Please see the history of present illness.    All other systems reviewed and are negative.  EKGs/Labs/Other Studies Reviewed:    The following  studies were reviewed today:  EKG:  NSR, nonspecific T wave abnl in lateral leads.  Recent Labs: 08/01/2018: Hemoglobin 15.3; Platelets 280 08/02/2018: BUN 9; Creatinine, Ser 1.02; Potassium 3.0; Sodium 135 04/06/2019: BNP 13.7  Recent Lipid Panel    Component Value Date/Time   CHOL 215 (H) 08/01/2018 0045   CHOL 162 07/06/2016 1023   TRIG 154 (H) 08/01/2018 0045   HDL 25 (L) 08/01/2018 0045   HDL 27 (L) 07/06/2016 1023   CHOLHDL 8.6 08/01/2018 0045   VLDL 31 08/01/2018 0045   LDLCALC 159 (H) 08/01/2018 0045   LDLCALC 105 (H) 07/06/2016 1023    Physical Exam:    VS:  BP (!) 142/88   Pulse 72   Temp 97.7 F (36.5 C)   Ht 5\' 11"  (1.803 m)   Wt 262 lb 3.2 oz (118.9 kg)   SpO2 99%   BMI 36.57 kg/m    No flowsheet data found.    Wt Readings from Last 5 Encounters:  04/14/19 262 lb 3.2 oz (118.9 kg)  04/06/19 260 lb (117.9 kg)  12/14/16 270 lb (122.5 kg)  07/06/16 279 lb (126.6 kg)  02/29/16 279 lb 12.8 oz (126.9 kg)     Constitutional: No acute distress Eyes: sclera non-icteric, normal conjunctiva and lids ENMT: normal dentition, moist mucous membranes Cardiovascular: regular rhythm, normal rate, no murmurs. S1 and S2 normal. Radial pulses normal bilaterally. No jugular venous distention.  Respiratory: clear to auscultation bilaterally GI : normal bowel sounds, soft and nontender. No distention.   MSK: extremities warm, well perfused. No edema.  NEURO: grossly  nonfocal exam, moves all extremities. PSYCH: alert and oriented x 3, normal mood and affect.   ASSESSMENT:    1. Essential hypertension   2. Mild concentric left ventricular hypertrophy (LVH)   3. Precordial pain   4. Kawasaki disease (HCC)   5. Sleep apnea, unspecified type   6. Daytime sleepiness   7. Snoring    PLAN:    Hypertension - patient has elevated BP above goal and reduced EF on nuc stress from 2017. We will start coreg 3.125 mg BID for possible reduced EF  Kawasaki's disease - he tells me he has a history of Kawasaki's disease and says he has had previous stress tests. I am concerned about his chest pain and we have not evaluated his coronary arteries especially in light of a possible lower ejection fraction. We will start with a CCTA and expand the field of view to include the aorta to ensure we are evaluating for vascular aneurysm.   Chest pain - as above, CCTA to evaulate for coronary aneurysm and obstructive CAD. Risk factors of DM, HTN, HLD, family history of CAD  HLD - continue atorvastatin for now. Has mild myalgias, can try co-q10, otherwise based on results of CCTA we can discuss alternate therapy.   OSA - has a cpap but doesn't use it and wants to be reevaluated, will refer for sleep study.   Reduced EF - reduced EF on nuc study in 2017, without definite HF symptoms. Needs reevaluation, will obtain echo to assess. Recommend 3D EF if image quality permits.    Total time of encounter: 80 minutes total time of encounter, including 45 minutes spent in face-to-face patient care. This time includes coordination of care and counseling regarding workup and management of chest pain, HTN, HLD. Remainder of non-face-to-face time involved reviewing chart documents/testing relevant to the patient encounter and documentation in the medical record.  2018, MD  [place]  [facility]   Medication Adjustments/Labs and Tests Ordered: Current medicines are  reviewed at length with the patient today.  Concerns regarding medicines are outlined above.  Orders Placed This Encounter  Procedures  . CT CORONARY MORPH W/CTA COR W/SCORE W/CA W/CM &/OR WO/CM  . CT CORONARY FRACTIONAL FLOW RESERVE DATA PREP  . CT CORONARY FRACTIONAL FLOW RESERVE FLUID ANALYSIS  . Basic metabolic panel  . EKG 12-Lead  . ECHOCARDIOGRAM COMPLETE  . Split night study   Meds ordered this encounter  Medications  . metoprolol tartrate (LOPRESSOR) 50 MG tablet    Sig: Take 1 tablet (50 mg total) by mouth once for 1 dose. TAKE TWO HOUR PRIOR TO  SCHEDULE CARDIAC TEST    Dispense:  1 tablet    Refill:  0    ONE TIME ONLY MEDICATION  THIS IN ADDITION TO COREG  . carvedilol (COREG) 3.125 MG tablet    Sig: Take 1 tablet (3.125 mg total) by mouth 2 (two) times daily.    Dispense:  60 tablet    Refill:  4    Patient Instructions  Medication Instructions:  SEE INSTRUCTION SHEET FOR CCTA *If you need a refill on your cardiac medications before your next appointment, please call your pharmacy*  Lab Work: SEE INSTRUCTION FOR CCTA  If you have labs (blood work) drawn today and your tests are completely normal, you will receive your results only by: Marland Kitchen MyChart Message (if you have MyChart) OR . A paper copy in the mail If you have any lab test that is abnormal or we need to change your treatment, we will call you to review the results.  Testing/Procedures: Will be schedule at [facility] [address] 1-Your physician has requested that you have an echocardiogram. Echocardiography is a painless test that uses sound waves to create images of your heart. It provides your doctor with information about the size and shape of your heart and how well your heart's chambers and valves are working. This procedure takes approximately one hour. There are no restrictions for this procedure. AND  WILL BE SCHEDULE AT [facility] 2-Your physician has requested that you  have cardiac CTA. Cardiac computed tomography (CT) is a painless test that uses an x-ray machine to take clear, detailed pictures of your heart. For further information please visit [email or web address] Please follow instruction sheet as given.   WILL BE SCHEDULE AT [facility] IF PROMPTED ONCE INSURANCE AUTHORIZED  3-Your physician has recommended that you have a sleep study. This test records several body functions during sleep, including: brain activity, eye movement, oxygen and carbon dioxide blood levels, heart rate and rhythm, breathing rate and rhythm, the flow of air through your mouth and nose, snoring, body muscle movements, and chest and belly movement.    Follow-Up: At [facility], you and your health needs are our priority.  As part of our continuing mission to provide you with exceptional heart care, we have created designated Provider Care Teams.  These Care Teams include your primary Cardiologist (physician) and Advanced Practice Providers (APPs -  Physician Assistants and Nurse Practitioners) who all work together to provide you with the care you need, when you need it.  Your next appointment:   1 month(s)  The format for your next appointment:   In Person  Provider:   Weston Brass, MD  Other Instructions

## 2019-04-15 ENCOUNTER — Telehealth: Payer: Self-pay | Admitting: *Deleted

## 2019-04-15 NOTE — Telephone Encounter (Signed)
PA for sleep study faxed to [facility]. 

## 2019-04-27 ENCOUNTER — Other Ambulatory Visit (HOSPITAL_COMMUNITY): Payer: 59

## 2019-04-28 ENCOUNTER — Telehealth: Payer: Self-pay | Admitting: *Deleted

## 2019-04-28 NOTE — Telephone Encounter (Signed)
PA request for sleep study has been resent to [facility]. When I called yesterday to check on the status of the request sent on 04/15/19, I was told they don't have a request ( after I was told yesterday they did and it was approved and it would be faxed.)

## 2019-05-04 ENCOUNTER — Ambulatory Visit (INDEPENDENT_AMBULATORY_CARE_PROVIDER_SITE_OTHER): Payer: 59 | Admitting: Internal Medicine

## 2019-05-04 ENCOUNTER — Encounter: Payer: Self-pay | Admitting: Internal Medicine

## 2019-05-04 DIAGNOSIS — M303 Mucocutaneous lymph node syndrome [Kawasaki]: Secondary | ICD-10-CM

## 2019-05-04 NOTE — Progress Notes (Deleted)
Cardiology Office Note:    Date:  05/04/2019   ID:  Sander Radon., DOB Feb 04, 1976, MRN [id number]  PCP:  Kerin Perna, NP  Cardiologist:  Elouise Munroe, MD  Electrophysiologist:  None   Referring MD: Kerin Perna, NP   Chief Complaint: ***  History of Present Illness:    Justin Liu. is a 44 y.o. 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  Started carvedilol last visit ***.  Past Medical History:  Diagnosis Date  . Asthma   . Hypertension   . Irregular heartbeat     No past surgical history on file.  Current Medications: No outpatient medications have been marked as taking for the 05/04/19 encounter (Appointment) with Elouise Munroe, MD.     Allergies:   Watermelon [citrullus vulgaris] and Lisinopril   Social History   Socioeconomic History  . Marital status: Married    Spouse name: Not on file  . Number of children: Not on file  . Years of education: Not on file  . Highest education level: Not on file  Occupational History  . Not on file  Tobacco Use  . Smoking status: Former Smoker    Years: 10.00    Quit date: 04/29/2008    Years since quitting: 11.0  . Smokeless tobacco: Never Used  . Tobacco comment: quit 3 years ago  Substance and Sexual Activity  . Alcohol use: No  . Drug use: No  . Sexual activity: Not on file  Other Topics Concern  . Not on file  Social History Narrative  . Not on file   Social Determinants of Health   Financial Resource Strain:   . Difficulty of Paying Living Expenses: Not on file  Food Insecurity:   . Worried About Charity fundraiser in the Last Year: Not on file  . Ran Out of Food in the Last Year: Not on file  Transportation Needs:   . Lack of Transportation (Medical): Not on file  . Lack of Transportation (Non-Medical): Not on  file  Physical Activity:   . Days of Exercise per Week: Not on file  . Minutes of Exercise per Session: Not on file  Stress:   . Feeling of Stress : Not on file  Social Connections:   . Frequency of Communication with Friends and Family: Not on file  . Frequency of Social Gatherings with Friends and Family: Not on file  . Attends Religious Services: Not on file  . Active Member of Clubs or Organizations: Not on file  . Attends Archivist Meetings: Not on file  . Marital Status: Not on file     Family History: The patient's family history includes Diabetes in his father; Heart attack in his paternal grandfather, paternal grandmother, and paternal uncle; Heart disease in his father, paternal grandfather, and paternal grandmother; Hypertension in his father and mother; Stroke in his mother.  ROS:   Please see the history of present illness.    All other systems reviewed and are negative.  EKGs/Labs/Other Studies Reviewed:    The following studies were reviewed today:  EKG:  ***  I have independently reviewed the images from Chest Xray/CTA chest/CT chest dated ***.  Recent Labs: 08/01/2018: Hemoglobin 15.3; Platelets 280 08/02/2018: BUN 9; Creatinine, Ser 1.02; Potassium 3.0; Sodium 135 04/06/2019: BNP 13.7  Recent Lipid Panel  Component Value Date/Time   CHOL 215 (H) 08/01/2018 0045   CHOL 162 07/06/2016 1023   TRIG 154 (H) 08/01/2018 0045   HDL 25 (L) 08/01/2018 0045   HDL 27 (L) 07/06/2016 1023   CHOLHDL 8.6 08/01/2018 0045   VLDL 31 08/01/2018 0045   LDLCALC 159 (H) 08/01/2018 0045   LDLCALC 105 (H) 07/06/2016 1023    Physical Exam:    VS:  There were no vitals taken for this visit.    Wt Readings from Last 5 Encounters:  04/14/19 262 lb 3.2 oz (118.9 kg)  04/06/19 260 lb (117.9 kg)  12/14/16 270 lb (122.5 kg)  07/06/16 279 lb (126.6 kg)  02/29/16 279 lb 12.8 oz (126.9 kg)     Constitutional: No acute distress Eyes: sclera non-icteric, normal  conjunctiva and lids ENMT: normal dentition, moist mucous membranes Cardiovascular: regular rhythm, normal rate, no murmurs. S1 and S2 normal. Radial pulses normal bilaterally. No jugular venous distention.  Respiratory: clear to auscultation bilaterally GI : normal bowel sounds, soft and nontender. No distention.   MSK: extremities warm, well perfused. No edema.  NEURO: grossly nonfocal exam, moves all extremities. PSYCH: alert and oriented x 3, normal mood and affect.      ASSESSMENT:    No diagnosis found. PLAN:   *** Hypertension - patient has elevated BP above goal and reduced EF on nuc stress from 2017. We will start coreg 3.125 mg BID for possible reduced EF  Kawasaki's disease - he tells me he has a history of Kawasaki's disease and says he has had previous stress tests. I am concerned about his chest pain and we have not evaluated his coronary arteries especially in light of a possible lower ejection fraction. We will start with a CCTA and expand the field of view to include the aorta to ensure we are evaluating for vascular aneurysm.   Chest pain - as above, CCTA to evaulate for coronary aneurysm and obstructive CAD. Risk factors of DM, HTN, HLD, family history of CAD  HLD - continue atorvastatin for now. Has mild myalgias, can try co-q10, otherwise based on results of CCTA we can discuss alternate therapy.   OSA - has a cpap but doesn't use it and wants to be reevaluated, will refer for sleep study.   Reduced EF - reduced EF on nuc study in 2017, without definite HF symptoms. Needs reevaluation, will obtain echo to assess. Recommend 3D EF if image quality permits.    Total time of encounter: *** minutes total time of encounter, including *** minutes spent in face-to-face patient care. This time includes coordination of care and counseling regarding above mentioned problem list. Remainder of non-face-to-face time involved reviewing chart documents/testing relevant to  the patient encounter and documentation in the medical record. I have independently reviewed documentation from referring provider.   Weston Brass, MD [place]  [facility]    Medication Adjustments/Labs and Tests Ordered: Current medicines are reviewed at length with the patient today.  Concerns regarding medicines are outlined above.  No orders of the defined types were placed in this encounter.  No orders of the defined types were placed in this encounter.   There are no Patient Instructions on file for this visit.

## 2019-05-05 ENCOUNTER — Telehealth: Payer: Self-pay | Admitting: *Deleted

## 2019-05-05 NOTE — Telephone Encounter (Signed)
MyChart message sent to patient with sleep study appointment details.

## 2019-05-11 ENCOUNTER — Encounter: Payer: Self-pay | Admitting: Family Medicine

## 2019-05-11 ENCOUNTER — Other Ambulatory Visit (HOSPITAL_COMMUNITY)
Admission: RE | Admit: 2019-05-11 | Discharge: 2019-05-11 | Disposition: A | Discharge status: Home / Self Care | Payer: 59 | Source: Ambulatory Visit | Attending: Cardiovascular Disease | Admitting: Cardiovascular Disease

## 2019-05-11 ENCOUNTER — Ambulatory Visit: Payer: Self-pay | Admitting: Family Medicine

## 2019-05-11 ENCOUNTER — Other Ambulatory Visit: Payer: Self-pay

## 2019-05-11 ENCOUNTER — Ambulatory Visit (HOSPITAL_BASED_OUTPATIENT_CLINIC_OR_DEPARTMENT_OTHER): Payer: 59 | Admitting: Family Medicine

## 2019-05-11 DIAGNOSIS — J45909 Unspecified asthma, uncomplicated: Secondary | ICD-10-CM | POA: Insufficient documentation

## 2019-05-11 DIAGNOSIS — I499 Cardiac arrhythmia, unspecified: Secondary | ICD-10-CM | POA: Diagnosis not present

## 2019-05-11 DIAGNOSIS — E785 Hyperlipidemia, unspecified: Secondary | ICD-10-CM

## 2019-05-11 DIAGNOSIS — Z794 Long term (current) use of insulin: Secondary | ICD-10-CM | POA: Diagnosis not present

## 2019-05-11 DIAGNOSIS — Z79899 Other long term (current) drug therapy: Secondary | ICD-10-CM | POA: Diagnosis not present

## 2019-05-11 DIAGNOSIS — I10 Essential (primary) hypertension: Secondary | ICD-10-CM | POA: Insufficient documentation

## 2019-05-11 DIAGNOSIS — E1169 Type 2 diabetes mellitus with other specified complication: Secondary | ICD-10-CM | POA: Insufficient documentation

## 2019-05-11 DIAGNOSIS — Z7901 Long term (current) use of anticoagulants: Secondary | ICD-10-CM | POA: Diagnosis not present

## 2019-05-11 DIAGNOSIS — Z20822 Contact with and (suspected) exposure to covid-19: Secondary | ICD-10-CM | POA: Diagnosis not present

## 2019-05-11 DIAGNOSIS — M303 Mucocutaneous lymph node syndrome [Kawasaki]: Secondary | ICD-10-CM | POA: Diagnosis not present

## 2019-05-11 DIAGNOSIS — Z01812 Encounter for preprocedural laboratory examination: Secondary | ICD-10-CM | POA: Diagnosis not present

## 2019-05-11 DIAGNOSIS — Z7982 Long term (current) use of aspirin: Secondary | ICD-10-CM | POA: Insufficient documentation

## 2019-05-11 LAB — SARS CORONAVIRUS 2 (TAT 6-24 HRS): SARS Coronavirus 2: NEGATIVE

## 2019-05-11 MED ORDER — ATORVASTATIN CALCIUM 20 MG PO TABS: 20.0000 mg | ORAL_TABLET | Freq: Every day | ORAL | 2 refills | Status: DC

## 2019-05-11 NOTE — Progress Notes (Signed)
Patient has been called and DOB has been verified. Patient has been screened and transferred to PCP to start phone visit.   States that he is still having tremeros in his chest

## 2019-05-11 NOTE — Progress Notes (Signed)
Virtual Visit via Telephone Note  I connected with Justin Liu., on 05/11/2019 at 2:24 PM by telephone due to the COVID-19 pandemic and verified that I am speaking with the correct person using two identifiers.   Consent: I discussed the limitations, risks, security and privacy concerns of performing an evaluation and management service by telephone and the availability of in person appointments. I also discussed with the patient that there may be a patient responsible charge related to this service. The patient expressed understanding and agreed to proceed.   Location of Patient: Home  Location of Provider: Clinic   Persons participating in Telemedicine visit: Rayyan Burley. Melba Coon Dr. Alvis Lemmings     History of Present Illness: 44 year old male with a history of Hypertension, Type 2 DM (A1c 6.0)   here for a follow up visit. Seen at Cardiology visit last month for chest pain and ? H/o Kawasaki disease, ongoing work up including cardiac CTA, echo, sleep study. CTA appointment comes up tomorrow His BP at home was 126/81.  Blood sugars have been under 100, denies hypoglycemia, blurry vision, paresthesias; currently on Lantus 28 units at bedtime; Novolog 8 units tid. Last seen by Endocrine, Dr Lafe Garin in the summer of last year. He has noticed fingertips are very cold but do not change color. He has no additional concerns.  Past Medical History:  Diagnosis Date  . Asthma   . Hypertension   . Irregular heartbeat   . Kawasaki disease (HCC)    Allergies  Allergen Reactions  . Watermelon [Citrullus Vulgaris] Anaphylaxis    Patient states he has eaten watermelon recently with no reaction and believes he has grown out of it.  . Lisinopril Hives    Swollen gums    Current Outpatient Medications on File Prior to Visit  Medication Sig Dispense Refill  . albuterol (PROVENTIL HFA;VENTOLIN HFA) 108 (90 Base) MCG/ACT inhaler Inhale 2 puffs into the  lungs every 4 (four) hours as needed for wheezing or shortness of breath. 1 Inhaler 3  . amLODipine (NORVASC) 10 MG tablet Take 1 tablet (10 mg total) by mouth daily. 30 tablet 2  . aspirin EC 81 MG tablet Take 1 tablet (81 mg total) by mouth daily. 30 tablet 2  . atorvastatin (LIPITOR) 20 MG tablet Take 1 tablet (20 mg total) by mouth at bedtime. 30 tablet 2  . carvedilol (COREG) 3.125 MG tablet Take 1 tablet (3.125 mg total) by mouth 2 (two) times daily. 60 tablet 4  . cetirizine (ZYRTEC) 10 MG tablet Take 1 tablet (10 mg total) by mouth daily. 30 tablet 3  . fluticasone (FLONASE) 50 MCG/ACT nasal spray PLACE 2 SPRAYS INTO BOTH NOSTRILS DAILY. 16 g 2  . insulin aspart (NOVOLOG) 100 UNIT/ML injection Inject 8 Units into the skin 3 (three) times daily with meals. 10 mL 2  . insulin aspart (NOVOLOG) 100 UNIT/ML injection Inject 0-9 Units into the skin 3 (three) times daily with meals. CORRECTION FACTOR Sliding scale CBG 70 - 120: 0 units CBG 121 - 150: 1 unit,  CBG 151 - 200: 2 units,  CBG 201 - 250: 3 units,  CBG 251 - 300: 5 units,  CBG 301 - 350: 7 units,  CBG 351 - 400: 9 units   CBG > 400: 9 units and notify your MD 10 mL 2  . insulin aspart (NOVOLOG) 100 UNIT/ML injection Inject 8 Units into the skin 3 (three) times daily with meals. 10 mL 3  .  insulin glargine (LANTUS) 100 UNIT/ML injection Inject 0.3 mLs (30 Units total) into the skin daily. 10 mL 3  . Insulin Pen Needle (TRUEPLUS 5-BEVEL PEN NEEDLES) 32G X 4 MM MISC Use as directed to inject insulin daily. 100 each 11  . Insulin Syringes, Disposable, U-100 0.5 ML MISC 0-9 Units by Does not apply route 3 (three) times daily with meals. 100 each 3  . losartan (COZAAR) 100 MG tablet Take 1 tablet (100 mg total) by mouth daily. Must have office visit for refills 30 tablet 2  . losartan (COZAAR) 100 MG tablet Take 1 tablet (100 mg total) by mouth daily. 90 tablet 0  . colchicine 0.6 MG tablet Take 1 tablet (0.6 mg total) by mouth 2 (two) times  daily as needed for up to 7 days (gout pain). 14 tablet 0  . metoprolol tartrate (LOPRESSOR) 50 MG tablet Take 1 tablet (50 mg total) by mouth once for 1 dose. TAKE TWO HOUR PRIOR TO  SCHEDULE CARDIAC TEST 1 tablet 0   No current facility-administered medications on file prior to visit.    Observations/Objective: Alert, awake, oriented x3 Not in acute distress  CMP Latest Ref Rng & Units 08/02/2018 08/01/2018 08/01/2018  Glucose 70 - 99 mg/dL 281(H) 159(H) 442(H)  BUN 6 - 20 mg/dL 9 8 15   Creatinine 0.61 - 1.24 mg/dL 1.02 0.97 1.23  Sodium 135 - 145 mmol/L 135 139 139  Potassium 3.5 - 5.1 mmol/L 3.0(L) 3.2(L) 5.5(H)  Chloride 98 - 111 mmol/L 100 102 103  CO2 22 - 32 mmol/L 24 25 20(L)  Calcium 8.9 - 10.3 mg/dL 9.1 9.4 9.4  Total Protein 6.0 - 8.5 g/dL - - -  Total Bilirubin 0.0 - 1.2 mg/dL - - -  Alkaline Phos 39 - 117 IU/L - - -  AST 0 - 40 IU/L - - -  ALT 0 - 44 IU/L - - -    Lipid Panel     Component Value Date/Time   CHOL 215 (H) 08/01/2018 0045   CHOL 162 07/06/2016 1023   TRIG 154 (H) 08/01/2018 0045   HDL 25 (L) 08/01/2018 0045   HDL 27 (L) 07/06/2016 1023   CHOLHDL 8.6 08/01/2018 0045   VLDL 31 08/01/2018 0045   LDLCALC 159 (H) 08/01/2018 0045   LDLCALC 105 (H) 07/06/2016 1023   LABVLDL 30 07/06/2016 1023    Lab Results  Component Value Date   HGBA1C 6.0 04/06/2019    Assessment and Plan: 1. Hyperlipidemia associated with type 2 diabetes mellitus (HCC) Uncontrolled, LDL is 105 which is > goal of <100 Low cholesterol diet Lipid panel at next visit and adjust regimen accordingly - atorvastatin (LIPITOR) 20 MG tablet; Take 1 tablet (20 mg total) by mouth at bedtime.  Dispense: 30 tablet; Refill: 2  2. Type 2 diabetes mellitus with other specified complication, with long-term current use of insulin (HCC) Controlled with A1c of 6.0 Continue current management Counseled on Diabetic diet, my plate method, 017 minutes of moderate intensity exercise/week Blood  sugar logs with fasting goals of 80-120 mg/dl, random of less than 180 and in the event of sugars less than 60 mg/dl or greater than 400 mg/dl encouraged to notify the clinic. Advised on the need for annual eye exams, annual foot exams, Pneumonia vaccine.   3. Essential hypertension Controlled Continue Amlodipine, Losartan, Metoprolol Counseled on blood pressure goal of less than 130/80, low-sodium, DASH diet, medication compliance, 150 minutes of moderate intensity exercise per week. Discussed medication compliance,  adverse effects.    Follow Up Instructions: Return in about 3 months (around 08/11/2019) for Chronic disease management.    I discussed the assessment and treatment plan with the patient. The patient was provided an opportunity to ask questions and all were answered. The patient agreed with the plan and demonstrated an understanding of the instructions.   The patient was advised to call back or seek an in-person evaluation if the symptoms worsen or if the condition fails to improve as anticipated.     I provided  18 minutes total of non-face-to-face time during this encounter including median intraservice time, reviewing previous notes, investigations, ordering medications, medical decision making, coordinating care and patient verbalized understanding at the end of the visit.     Hoy Register, MD, FAAFP. [facility] [place], [place] [phone]   05/11/2019, 2:24 PM

## 2019-05-12 ENCOUNTER — Ambulatory Visit (HOSPITAL_COMMUNITY): Payer: 59 | Attending: Internal Medicine

## 2019-05-12 ENCOUNTER — Other Ambulatory Visit: Payer: Self-pay

## 2019-05-12 DIAGNOSIS — M303 Mucocutaneous lymph node syndrome [Kawasaki]: Secondary | ICD-10-CM | POA: Diagnosis present

## 2019-05-12 DIAGNOSIS — R072 Precordial pain: Secondary | ICD-10-CM | POA: Diagnosis present

## 2019-05-12 DIAGNOSIS — I10 Essential (primary) hypertension: Secondary | ICD-10-CM | POA: Insufficient documentation

## 2019-05-12 DIAGNOSIS — I517 Cardiomegaly: Secondary | ICD-10-CM | POA: Diagnosis present

## 2019-05-13 ENCOUNTER — Encounter (HOSPITAL_BASED_OUTPATIENT_CLINIC_OR_DEPARTMENT_OTHER): Payer: 59 | Admitting: Cardiovascular Disease

## 2019-05-15 ENCOUNTER — Other Ambulatory Visit (HOSPITAL_COMMUNITY)
Admission: RE | Admit: 2019-05-15 | Discharge: 2019-05-15 | Disposition: A | Discharge status: Home / Self Care | Payer: 59 | Source: Ambulatory Visit | Attending: Internal Medicine | Admitting: Internal Medicine

## 2019-05-15 DIAGNOSIS — Z20822 Contact with and (suspected) exposure to covid-19: Secondary | ICD-10-CM | POA: Insufficient documentation

## 2019-05-15 DIAGNOSIS — Z01812 Encounter for preprocedural laboratory examination: Secondary | ICD-10-CM | POA: Diagnosis present

## 2019-05-15 LAB — SARS CORONAVIRUS 2 (TAT 6-24 HRS): SARS Coronavirus 2: NEGATIVE

## 2019-05-18 ENCOUNTER — Ambulatory Visit (HOSPITAL_BASED_OUTPATIENT_CLINIC_OR_DEPARTMENT_OTHER): Payer: 59 | Attending: Internal Medicine | Admitting: Cardiovascular Disease

## 2019-05-18 ENCOUNTER — Other Ambulatory Visit: Payer: Self-pay

## 2019-05-18 DIAGNOSIS — Z79899 Other long term (current) drug therapy: Secondary | ICD-10-CM | POA: Diagnosis not present

## 2019-05-18 DIAGNOSIS — R400 Somnolence: Secondary | ICD-10-CM

## 2019-05-18 DIAGNOSIS — Z794 Long term (current) use of insulin: Secondary | ICD-10-CM | POA: Diagnosis not present

## 2019-05-18 DIAGNOSIS — Z7982 Long term (current) use of aspirin: Secondary | ICD-10-CM | POA: Insufficient documentation

## 2019-05-18 DIAGNOSIS — G4733 Obstructive sleep apnea (adult) (pediatric): Secondary | ICD-10-CM | POA: Insufficient documentation

## 2019-05-18 DIAGNOSIS — G4730 Sleep apnea, unspecified: Secondary | ICD-10-CM

## 2019-05-18 DIAGNOSIS — R0683 Snoring: Secondary | ICD-10-CM

## 2019-05-18 DIAGNOSIS — M303 Mucocutaneous lymph node syndrome [Kawasaki]: Secondary | ICD-10-CM

## 2019-05-18 DIAGNOSIS — R072 Precordial pain: Secondary | ICD-10-CM

## 2019-05-20 MED FILL — FLUTICASONE PROP 50 MCG SPR: 50 | 30 days supply | Qty: 16 | Fill #2

## 2019-05-22 ENCOUNTER — Other Ambulatory Visit: Payer: Self-pay | Admitting: Emergency Medicine

## 2019-05-22 ENCOUNTER — Telehealth (HOSPITAL_COMMUNITY): Payer: Self-pay | Admitting: Emergency Medicine

## 2019-05-22 ENCOUNTER — Encounter (HOSPITAL_COMMUNITY): Payer: Self-pay

## 2019-05-22 DIAGNOSIS — M303 Mucocutaneous lymph node syndrome [Kawasaki]: Secondary | ICD-10-CM

## 2019-05-22 NOTE — Telephone Encounter (Signed)
Reaching out to patient to offer assistance regarding upcoming cardiac imaging study; pt verbalizes understanding of appt date/time, parking situation and where to check in, pre-test NPO status and medications ordered, and verified current allergies; name and call back number provided for further questions should they arise Rockwell Alexandria RN Navigator Cardiac Imaging [facility] [phone] office [phone] cell  Pt appreciated the reminder to have labs drawn prior to CCTA appt Monday. Also reminded to take PO metoprolol 2 hr prior to scan   [place] Dec

## 2019-05-23 NOTE — Procedures (Signed)
Patient Name: Justin Liu, Justin Liu Date: 05/18/2019 Gender: Male D.O.B: [birth year] Age (years): 40 Referring Provider: Cherlynn Kaiser MD Height (inches): 60 Interpreting Physician: Shelva Majestic MD, ABSM Weight (lbs): 268 RPSGT: Gwenyth Allegra BMI: 52 MRN: [id number] Neck Size: 18.50  CLINICAL INFORMATION The patient is referred for a split night study with BPAP.   Previous study 01/30/2016: AHI 85/h; REM AHI 68.6/h; O2 nadir 78%; BiPAP titrated to 25/21.  MEDICATIONS albuterol (PROVENTIL HFA;VENTOLIN HFA) 108 (90 Base) MCG/ACT inhaler  amLODipine (NORVASC) 10 MG tablet  aspirin EC 81 MG tablet  atorvastatin (LIPITOR) 20 MG tablet  carvedilol (COREG) 3.125 MG tablet  cetirizine (ZYRTEC) 10 MG tablet  colchicine 0.6 MG tablet(Expired)  fluticasone (FLONASE) 50 MCG/ACT nasal spray  insulin aspart (NOVOLOG) 100 UNIT/ML injection  insulin glargine (LANTUS) 100 UNIT/ML injection  Insulin Pen Needle (TRUEPLUS 5-BEVEL PEN NEEDLES) 32G X 4 MM MISC  Insulin Syringes, Disposable, U-100 0.5 ML MISC  losartan (COZAAR) 100 MG tablet  metoprolol tartrate (LOPRESSOR) 50 MG tablet(Expired)   Medications self-administered by patient taken the night of the study : N/A  SLEEP STUDY TECHNIQUE As per the AASM Manual for the Scoring of Sleep and Associated Events v2.3 (April 2016) with a hypopnea requiring 4% desaturations.  The channels recorded and monitored were frontal, central and occipital EEG, electrooculogram (EOG), submentalis EMG (chin), nasal and oral airflow, thoracic and abdominal wall motion, anterior tibialis EMG, snore microphone, electrocardiogram, and pulse oximetry. Bi-level positive airway pressure (BiPAP) was initiated when the patient met split night criteria and was titrated according to treat sleep-disordered breathing.  RESPIRATORY PARAMETERS Diagnostic Total AHI (/hr): 76.6 RDI (/hr): 77.6 OA Index (/hr): 52.5 CA Index (/hr): 0.5 REM AHI (/hr): 63.4 NREM  AHI (/hr): 80.2 Supine AHI (/hr): 68.2 Non-supine AHI (/hr): 79.24 Min O2 Sat (%): 68.0 Mean O2 (%): 88.0 Time below 88% (min): 62.7   Titration Optimal IPAP Pressure (cm): 24 Optimal EPAP Pressure (cm): 20 AHI at Optimal Pressure (/hr): 2.4 Min O2 at Optimal Pressure (%): 91.0 Sleep % at Optimal (%): 91 Supine % at Optimal (%): 100   SLEEP ARCHITECTURE The study was initiated at 10:22:51 PM and terminated at 4:29:55 AM. The total recorded time was 367.1 minutes. EEG confirmed total sleep time was 336.6 minutes yielding a sleep efficiency of 91.7%%. Sleep onset after lights out was 4.4 minutes with a REM latency of 95.0 minutes. The patient spent 6.4%% of the night in stage N1 sleep, 55.3%% in stage N2 sleep, 0.0%% in stage N3 and 38.3% in REM. Wake after sleep onset (WASO) was 26.0 minutes. The Arousal Index was 30.8/hour.  LEG MOVEMENT DATA The total Periodic Limb Movements of Sleep (PLMS) were 0. The PLMS index was 0.0 .  CARDIAC DATA The 2 lead EKG demonstrated sinus rhythm. The mean heart rate was 100.0 beats per minute. Other EKG findings include: None.  IMPRESSIONS - Severe obstructive sleep apnea occurred during the diagnostic portion of the study (AHI 76.6 /hour).  BiPAP was initiated at 12/8 and was titrated to optimal pressure at 24/20 cm of water. AHI 2.4/h; O2 nadir 91%. - No significant central sleep apnea occurred during the diagnostic portion of the study (CAI = 0.5/hour). - Severe oxygen desaturation was noted during the diagnostic portion of the study to a nadir of 68.0%. - The patient snored with loud snoring volume during the diagnostic portion of the study. - No cardiac abnormalities were noted during this study; rare PACs. - Clinically significant periodic limb  movements of sleep did not occur during the study.  DIAGNOSIS - Obstructive Sleep Apnea (327.23 [G47.33 ICD-10])  RECOMMENDATIONS - Recommend an initial trial of BiPAP therapy at 24/20 cm H2O with heated  humidification.  A Large size Fisher[employer]Paykel Full Face Mask Simplus mask was used for the titration. - Effort should be made to optimize nasal and oropharyngeal patency. - Avoid alcohol, sedatives and other CNS depressants that may worsen sleep apnea and disrupt normal sleep architecture. - Sleep hygiene should be reviewed to assess factors that may improve sleep quality. - Weight management and regular exercise should be initiated or continued. - Recommend a download in 30 days and sleep clinic evaluation after one month of therapy.  [Electronically signed] 05/23/2019 10:06 AM  Shelva Majestic MD, [facility], [place], American Board of Sleep Medicine   NPI: [id number] [place] PH: [phone]   FX: [phone] [place]

## 2019-05-25 ENCOUNTER — Ambulatory Visit (HOSPITAL_COMMUNITY): Admission: RE | Admit: 2019-05-25 | Payer: 59 | Source: Ambulatory Visit

## 2019-06-01 ENCOUNTER — Encounter: Payer: Self-pay | Admitting: Internal Medicine

## 2019-06-01 ENCOUNTER — Telehealth (HOSPITAL_COMMUNITY): Payer: Self-pay | Admitting: Emergency Medicine

## 2019-06-01 ENCOUNTER — Encounter (HOSPITAL_COMMUNITY): Payer: Self-pay

## 2019-06-01 NOTE — Telephone Encounter (Signed)
Reaching out to patient to offer assistance regarding upcoming cardiac imaging study; pt verbalizes understanding of appt date/time, parking situation and where to check in, pre-test NPO status and medications ordered, and verified current allergies; name and call back number provided for further questions should they arise Jamarii Banks RN Navigator Cardiac Imaging  [facility] [phone] office [phone] cell 

## 2019-06-02 ENCOUNTER — Other Ambulatory Visit: Payer: Self-pay

## 2019-06-02 ENCOUNTER — Encounter: Payer: 59 | Admitting: *Deleted

## 2019-06-02 ENCOUNTER — Ambulatory Visit (HOSPITAL_COMMUNITY)
Admission: RE | Admit: 2019-06-02 | Discharge: 2019-06-02 | Disposition: A | Discharge status: Home / Self Care | Payer: 59 | Source: Ambulatory Visit | Attending: Internal Medicine | Admitting: Internal Medicine

## 2019-06-02 DIAGNOSIS — Z006 Encounter for examination for normal comparison and control in clinical research program: Secondary | ICD-10-CM

## 2019-06-02 DIAGNOSIS — R072 Precordial pain: Secondary | ICD-10-CM | POA: Diagnosis not present

## 2019-06-02 DIAGNOSIS — M303 Mucocutaneous lymph node syndrome [Kawasaki]: Secondary | ICD-10-CM | POA: Diagnosis not present

## 2019-06-02 LAB — POCT I-STAT CREATININE: Creatinine, Ser: 1 mg/dL (ref 0.61–1.24)

## 2019-06-02 MED ORDER — IOHEXOL 350 MG/ML SOLN
100.0000 mL | Freq: Once | INTRAVENOUS | Status: AC | PRN
  Administered 2019-06-02: 100 mL via INTRAVENOUS

## 2019-06-02 MED ORDER — NITROGLYCERIN 0.4 MG SL SUBL
0.8000 mg | SUBLINGUAL_TABLET | Freq: Once | SUBLINGUAL | Status: AC
  Administered 2019-06-02: 0.8 mg via SUBLINGUAL

## 2019-06-02 MED ORDER — NITROGLYCERIN 0.4 MG SL SUBL
SUBLINGUAL_TABLET | SUBLINGUAL | Status: AC
  Filled 2019-06-02: qty 2

## 2019-06-02 NOTE — Research (Signed)
CADFEM Informed Consent                  Subject Name:   Justin Liu   Subject met inclusion and exclusion criteria.  The informed consent form, study requirements and expectations were reviewed with the subject and questions and concerns were addressed prior to the signing of the consent form.  The subject verbalized understanding of the trial requirements.  The subject agreed to participate in the CADFEM trial and signed the informed consent.  The informed consent was obtained prior to performance of any protocol-specific procedures for the subject.  A copy of the signed informed consent was given to the subject and a copy was placed in the subject's medical record.   [place] Jerrye Seebeck, Research Assistant 06/02/2019 12:40 p.m.

## 2019-06-04 ENCOUNTER — Other Ambulatory Visit: Payer: Self-pay | Admitting: Cardiovascular Disease

## 2019-06-04 ENCOUNTER — Telehealth: Payer: Self-pay | Admitting: *Deleted

## 2019-06-04 DIAGNOSIS — I10 Essential (primary) hypertension: Secondary | ICD-10-CM

## 2019-06-04 DIAGNOSIS — G4736 Sleep related hypoventilation in conditions classified elsewhere: Secondary | ICD-10-CM

## 2019-06-04 DIAGNOSIS — G4733 Obstructive sleep apnea (adult) (pediatric): Secondary | ICD-10-CM

## 2019-06-04 DIAGNOSIS — IMO0002 Reserved for concepts with insufficient information to code with codable children: Secondary | ICD-10-CM

## 2019-06-04 NOTE — Telephone Encounter (Signed)
Patient notified of sleep study results. Orders for BIPAP machine placed for [facility] via Epic. Staff message sent to Karle Plumber notifying her order has been placed.

## 2019-06-05 ENCOUNTER — Telehealth: Payer: Self-pay | Admitting: *Deleted

## 2019-06-05 NOTE — Telephone Encounter (Signed)
Received a staff message from [facility] stating they are not contracted with patient's insurance and he does not have out of network benefits. Due to his insurance they are declining his BIPAP order. Order has been redirected to [facility].

## 2019-06-08 ENCOUNTER — Telehealth: Payer: Self-pay | Admitting: *Deleted

## 2019-06-08 NOTE — Telephone Encounter (Signed)
Left message   To review MyChart for echo result   need to schedule an  F/u visit Virtual or  In office

## 2019-06-08 NOTE — Telephone Encounter (Signed)
-----   Message from Parke Poisson, MD sent at 05/26/2019  1:26 PM EDT ----- Echo with overall preserved function of the right and left side of the heart pumping chambers. Ascending aorta is borderline dilated. He has a Coronary CTA ordered but he canceled his appointment yesterday and will need to be rescheduled. Can we get that CT rescheduled?

## 2019-06-08 NOTE — Telephone Encounter (Signed)
Transferred patient to nurse

## 2019-06-09 NOTE — Telephone Encounter (Signed)
error 

## 2019-06-30 ENCOUNTER — Other Ambulatory Visit: Payer: Self-pay | Admitting: Internal Medicine

## 2019-07-01 ENCOUNTER — Ambulatory Visit: Payer: 59 | Admitting: Internal Medicine

## 2019-07-08 ENCOUNTER — Other Ambulatory Visit: Payer: Self-pay | Admitting: Family Medicine

## 2019-07-08 DIAGNOSIS — E785 Hyperlipidemia, unspecified: Secondary | ICD-10-CM

## 2019-07-08 DIAGNOSIS — I10 Essential (primary) hypertension: Secondary | ICD-10-CM

## 2019-07-08 DIAGNOSIS — E1169 Type 2 diabetes mellitus with other specified complication: Secondary | ICD-10-CM

## 2019-07-22 ENCOUNTER — Other Ambulatory Visit: Payer: Self-pay

## 2019-07-23 ENCOUNTER — Ambulatory Visit: Payer: 59 | Admitting: Internal Medicine

## 2019-07-23 DIAGNOSIS — Z0289 Encounter for other administrative examinations: Secondary | ICD-10-CM

## 2019-07-27 ENCOUNTER — Other Ambulatory Visit: Payer: Self-pay

## 2019-07-27 ENCOUNTER — Ambulatory Visit (INDEPENDENT_AMBULATORY_CARE_PROVIDER_SITE_OTHER): Payer: 59 | Admitting: Internal Medicine

## 2019-07-27 ENCOUNTER — Encounter: Payer: Self-pay | Admitting: Internal Medicine

## 2019-07-27 VITALS — BP 124/88 | HR 64 | Temp 98.6°F | Ht 72.0 in | Wt 271.6 lb

## 2019-07-27 DIAGNOSIS — E1169 Type 2 diabetes mellitus with other specified complication: Secondary | ICD-10-CM | POA: Diagnosis not present

## 2019-07-27 DIAGNOSIS — Z794 Long term (current) use of insulin: Secondary | ICD-10-CM | POA: Insufficient documentation

## 2019-07-27 DIAGNOSIS — E119 Type 2 diabetes mellitus without complications: Secondary | ICD-10-CM | POA: Diagnosis not present

## 2019-07-27 DIAGNOSIS — E785 Hyperlipidemia, unspecified: Secondary | ICD-10-CM | POA: Diagnosis not present

## 2019-07-27 LAB — GLUCOSE, POCT (MANUAL RESULT ENTRY): POC Glucose: 121 mg/dl — AB (ref 70–99)

## 2019-07-27 MED ORDER — LANTUS SOLOSTAR 100 UNIT/ML ~~LOC~~ SOPN: 22.0000 [IU] | PEN_INJECTOR | Freq: Every day | SUBCUTANEOUS | 11 refills | Status: AC

## 2019-07-27 MED ORDER — OZEMPIC (0.25 OR 0.5 MG/DOSE) 2 MG/1.5ML ~~LOC~~ SOPN: 0.5000 mg | PEN_INJECTOR | SUBCUTANEOUS | 6 refills | Status: AC

## 2019-07-27 MED ORDER — INSULIN PEN NEEDLE 32G X 4 MM MISC: 1.0000 | Freq: Every day | 6 refills | Status: AC

## 2019-07-27 NOTE — Progress Notes (Signed)
Name: Justin Liu.  Age/ Sex: 44 y.o., male   MRN/ DOB: [id number], 05/02/1975     PCP: Annye English   Reason for Endocrinology Evaluation: Type 2 Diabetes Mellitus  Initial Endocrine Consultative Visit: 07/27/2019    PATIENT IDENTIFIER: Justin Liu. is a 44 y.o. male with a past medical history of T2DM, HTN and Dyslipidemia . The patient has followed with Endocrinology clinic since 07/27/2019 for consultative assistance with management of his diabetes.  DIABETIC HISTORY:  Justin Liu was diagnosed with T2DM in 07/2018, and started insulin therapy at the time . His hemoglobin A1c has ranged from 5.7% in 2021, peaking at 11.2% in 2020.  SUBJECTIVE:   Today (07/27/2019): Justin Liu is here for a follow up on diabetes management.  He checks his blood sugars 2 times daily. The patient has had hypoglycemic episodes since the last clinic visit, which typically occur a few times a week , most often occuring in the afternoon.  The patient is symptomatic with these episodes.     Eats 2-3 meals a day, snacks occasionally, has been cutting down on sugar-sweetened beverages  He has been exercising   HOME DIABETES REGIMEN:  Lantus 28 units daily  Novolog 8 units with each meal   Statin: Yes ACE-I/ARB: No    METER DOWNLOAD SUMMARY: Did not bring  Fasting < 100 mg/dL ( memory recall)    DIABETIC COMPLICATIONS: Microvascular complications:    Denies: retinopathy , neuropathy , CKD   Last Eye Exam: Completed 09/2018  Macrovascular complications:    Denies: CAD, CVA, PVD   HISTORY:  Past Medical History:  Past Medical History:  Diagnosis Date  . Asthma   . Hypertension   . Irregular heartbeat   . Kawasaki disease [facility])     Past Surgical History: No past surgical history on file.  Social History:  reports that he quit smoking about 11 years ago. He quit after 10.00 years of use. He has never used smokeless tobacco. He reports that he  does not drink alcohol or use drugs. Family History:  Family History  Problem Relation Age of Onset  . Hypertension Mother   . Stroke Mother   . Hypertension Father   . Diabetes Father   . Heart disease Father   . Heart attack Paternal Uncle   . Heart attack Paternal Grandmother   . Heart disease Paternal Grandmother   . Heart attack Paternal Grandfather   . Heart disease Paternal Grandfather      HOME MEDICATIONS: Allergies as of 07/27/2019      Reactions   Watermelon [citrullus Vulgaris] Anaphylaxis   Patient states he has eaten watermelon recently with no reaction and believes he has grown out of it.   Lisinopril Hives   Swollen gums      Medication List       Accurate as of Jul 27, 2019 10:29 AM. If you have any questions, ask your nurse or doctor.        STOP taking these medications   insulin aspart 100 UNIT/ML injection Commonly known as: NovoLOG Stopped by: Dorita Sciara, MD   insulin glargine 100 UNIT/ML injection Commonly known as: LANTUS Replaced by: Lantus SoloStar 100 UNIT/ML Solostar Pen Stopped by: Dorita Sciara, MD   Insulin Syringes (Disposable) U-100 0.5 ML Misc Stopped by: Dorita Sciara, MD     TAKE these medications   albuterol 108 (90 Base) MCG/ACT inhaler Commonly known as: VENTOLIN HFA  Inhale 2 puffs into the lungs every 4 (four) hours as needed for wheezing or shortness of breath.   amLODipine 10 MG tablet Commonly known as: NORVASC Take 1 tablet (10 mg total) by mouth daily.   aspirin EC 81 MG tablet Take 1 tablet (81 mg total) by mouth daily.   atorvastatin 20 MG tablet Commonly known as: LIPITOR TAKE 1 TABLET BY MOUTH EVERYDAY AT BEDTIME   carvedilol 3.125 MG tablet Commonly known as: COREG Take 1 tablet (3.125 mg total) by mouth 2 (two) times daily.   cetirizine 10 MG tablet Commonly known as: ZYRTEC Take 1 tablet (10 mg total) by mouth daily.   colchicine 0.6 MG tablet Take 1 tablet (0.6 mg total)  by mouth 2 (two) times daily as needed for up to 7 days (gout pain).   fluticasone 50 MCG/ACT nasal spray Commonly known as: FLONASE PLACE 2 SPRAYS INTO BOTH NOSTRILS DAILY.   Insulin Pen Needle 32G X 4 MM Misc 1 Device by Does not apply route daily. What changed:   how much to take  how to take this  when to take this  additional instructions Changed by: Scarlette Shorts, MD   Lantus SoloStar 100 UNIT/ML Solostar Pen Generic drug: insulin glargine Inject 22 Units into the skin daily. Replaces: insulin glargine 100 UNIT/ML injection Started by: Scarlette Shorts, MD   loratadine 10 MG tablet Commonly known as: CLARITIN Take by mouth.   losartan 100 MG tablet Commonly known as: COZAAR TAKE 1 TABLET (100 MG TOTAL) BY MOUTH DAILY. MUST HAVE OFFICE VISIT FOR REFILLS   metoprolol tartrate 50 MG tablet Commonly known as: LOPRESSOR Take 1 tablet (50 mg total) by mouth once for 1 dose. TAKE TWO HOUR PRIOR TO  SCHEDULE CARDIAC TEST   Ozempic (0.25 or 0.5 MG/DOSE) 2 MG/1.5ML Sopn Generic drug: Semaglutide(0.25 or 0.5MG /DOS) Inject 0.5 mg into the skin once a week. Started by: Scarlette Shorts, MD   UNABLE TO FIND Med Name: CVS meter 2 times daily        OBJECTIVE:   Vital Signs: BP 124/88 (BP Location: Left Arm, Patient Position: Sitting, Cuff Size: Large)   Pulse 64   Temp 98.6 F (37 C)   Ht 6' (1.829 m)   Wt 271 lb 9.6 oz (123.2 kg)   SpO2 98%   BMI 36.84 kg/m   Wt Readings from Last 3 Encounters:  07/27/19 271 lb 9.6 oz (123.2 kg)  05/18/19 268 lb (121.6 kg)  04/14/19 262 lb 3.2 oz (118.9 kg)     Exam: General: Pt appears well and is in NAD  HEENT:  Eyes: External eye exam normal without stare, lid lag or exophthalmos.  EOM intact.  Neck: General: Supple without adenopathy. Thyroid: Thyroid size normal.  No goiter or nodules appreciated. No thyroid bruit.  Lungs: Clear with good BS bilat with no rales, rhonchi, or wheezes  Heart: RRR with  normal S1 and S2 and no gallops; no murmurs; no rub  Abdomen: Normoactive bowel sounds, soft, nontender, without masses or organomegaly palpable  Extremities: No pretibial edema.   Skin: Normal texture and temperature to palpation.  Neuro: MS is good with appropriate affect, pt is alert and Ox3      DATA REVIEWED:  Lab Results  Component Value Date   HGBA1C 6.0 04/06/2019   HGBA1C 11.2 (H) 08/01/2018   HGBA1C 6.1 07/06/2016   Lab Results  Component Value Date   LDLCALC 159 (H) 08/01/2018   CREATININE 1.00  06/02/2019   Lab Results  Component Value Date   MICRALBCREAT 14 04/06/2019   07/13/2019 BUN/Cr 14/1.11 GFR 93 Tg 76 HDL 31 LDL 81  Alb/cr ratio 17 A1c 5.7%          ASSESSMENT / PLAN / RECOMMENDATIONS:   1) Type 2 Diabetes Mellitus, With Hypoglycemia, Without complications - Most recent A1c of 5.7 %. Goal A1c < 7.0%.      I have discussed with the patient the pathophysiology of diabetes. We went over the natural progression of the disease. We talked about both insulin resistance and insulin deficiency. We stressed the importance of lifestyle changes including diet and exercise. I explained the complications associated with diabetes including retinopathy, nephropathy, neuropathy as well as increased risk of cardiovascular disease. We went over the benefit seen with glycemic control.    I explained to the patient that diabetic patients are at higher than normal risk for amputations.   I have praised the pt on lifestyle changes and compliance with medications  He is concerned about his inability to lose weight, we discussed add-on therapy with GLP-1 agonits. He has no personal hx of pancreatitis nor FH of medullary thyroid cancer   We discussed GI side effects of Ozempic, coupon was provided    MEDICATIONS: - STOP Novolog - Decrease Lantus to 22 units daily  - Start Ozempic 0.25 mg ONCE weekly for 6 weeks, if no vomiting or diarrhea, you can increase it to 0.5  mg weekly    EDUCATION / INSTRUCTIONS:  BG monitoring instructions: Patient is instructed to check his blood sugars 2 times a day, fasting and bedtime.  Call [facility] if: BG persistently < 70 or > 300. . I reviewed the Rule of 15 for the treatment of hypoglycemia in detail with the patient. Literature supplied.    2) Diabetic complications:   Eye: Does not have known diabetic retinopathy.   Neuro/ Feet: Does not have known diabetic peripheral neuropathy .   Renal: Patient does not have known baseline CKD. He   is not on an ACEI/ARB at present. Up to date on  urine albumin/creatinine ratio (07/2019)   3) Dyslipidemia: Patient is on Atorvastatin. 20 mg daily . LDL went down from 159 to 80 mg/dL. Discussed cardiovascular benefits of statins.     F/U in 3 months    Signed electronically by: Lyndle Herrlich, MD  [facility] Endocrinology  [facility] [address] [place], [place] [address] Phone: [phone] FAX: [phone]   CC: Bryon Lions, PA-C [address] [place] [address] Phone: [phone]  Fax: [phone]  Return to Endocrinology clinic as below: Future Appointments  Date Time Provider Department Center  11/02/2019  8:50 AM Byford Schools, Konrad Dolores, MD [id number] None

## 2019-07-27 NOTE — Patient Instructions (Signed)
-   STOP Novolog - Decrease Lantus to 22 units daily  - Start Ozempic 0.25 mg ONCE weekly for 6 weeks, if no vomiting or diarrhea, you can increase it to 0.5 mg weekly       HOW TO TREAT LOW BLOOD SUGARS (Blood sugar LESS THAN 70 MG/DL)  Please follow the RULE OF 15 for the treatment of hypoglycemia treatment (when your (blood sugars are less than 70 mg/dL)    STEP 1: Take 15 grams of carbohydrates when your blood sugar is low, which includes:   3-4 GLUCOSE TABS  OR  3-4 OZ OF JUICE OR REGULAR SODA OR  ONE TUBE OF GLUCOSE GEL     STEP 2: RECHECK blood sugar in 15 MINUTES STEP 3: If your blood sugar is still low at the 15 minute recheck --> then, go back to STEP 1 and treat AGAIN with another 15 grams of carbohydrates.

## 2019-11-02 ENCOUNTER — Ambulatory Visit: Payer: 59 | Admitting: Internal Medicine

## 2021-02-13 IMAGING — DX PORTABLE CHEST - 1 VIEW
1 series · 1 of 1 positions shown · non-contrast
Comparison: 12/14/2016

CLINICAL DATA: Shortness of breath

EXAM:
PORTABLE CHEST 1 VIEW

[chest ap]
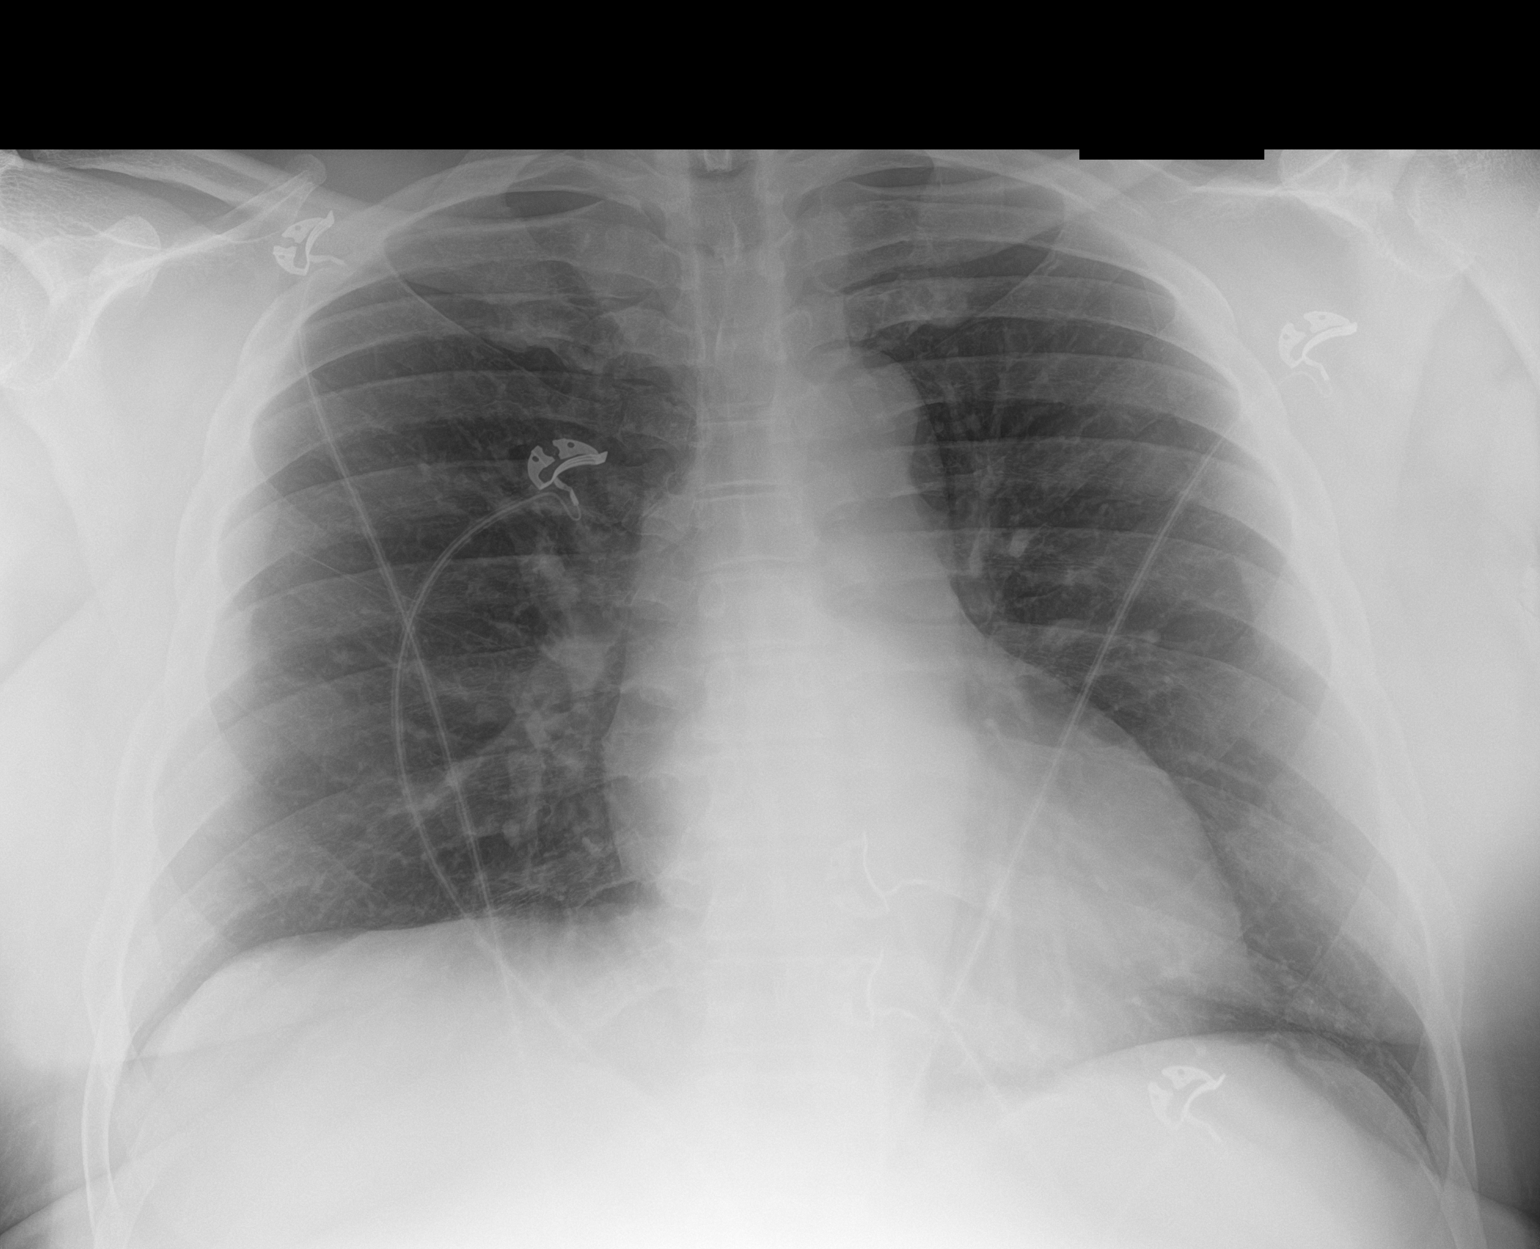

[1 of 1 positions shown; findings below may reference images not displayed]

FINDINGS: The heart size and mediastinal contours are within normal limits.
Both lungs are clear. The visualized skeletal structures are
unremarkable.
IMPRESSION: No active disease.

## 2021-03-07 ENCOUNTER — Other Ambulatory Visit: Payer: Self-pay

## 2022-01-21 ENCOUNTER — Other Ambulatory Visit: Payer: Self-pay

## 2022-01-21 ENCOUNTER — Emergency Department: Payer: Self-pay

## 2022-01-21 ENCOUNTER — Emergency Department
Admission: EM | Admit: 2022-01-21 | Discharge: 2022-01-21 | Disposition: A | Discharge status: Home / Self Care | Payer: Self-pay | Attending: Emergency Medicine | Admitting: Emergency Medicine

## 2022-01-21 DIAGNOSIS — I499 Cardiac arrhythmia, unspecified: Secondary | ICD-10-CM | POA: Insufficient documentation

## 2022-01-21 DIAGNOSIS — J45909 Unspecified asthma, uncomplicated: Secondary | ICD-10-CM | POA: Insufficient documentation

## 2022-01-21 DIAGNOSIS — I10 Essential (primary) hypertension: Secondary | ICD-10-CM | POA: Insufficient documentation

## 2022-01-21 DIAGNOSIS — H5461 Unqualified visual loss, right eye, normal vision left eye: Secondary | ICD-10-CM | POA: Insufficient documentation

## 2022-01-21 DIAGNOSIS — Z20822 Contact with and (suspected) exposure to covid-19: Secondary | ICD-10-CM | POA: Insufficient documentation

## 2022-01-21 LAB — SEDIMENTATION RATE: Sed Rate: 8 mm/hr (ref 0–15)

## 2022-01-21 LAB — BASIC METABOLIC PANEL
Anion gap: 8 (ref 5–15)
BUN: 18 mg/dL (ref 6–20)
CO2: 28 mmol/L (ref 22–32)
Calcium: 9.1 mg/dL (ref 8.9–10.3)
Chloride: 105 mmol/L (ref 98–111)
Creatinine, Ser: 1.2 mg/dL (ref 0.61–1.24)
GFR, Estimated: >60 mL/min (ref >60)
Glucose, Bld: 148 mg/dL — ABNORMAL HIGH (ref 70–99)
Potassium: 3.9 mmol/L (ref 3.5–5.1)
Sodium: 141 mmol/L (ref 135–145)

## 2022-01-21 LAB — CBC
HCT: 40.4 % (ref 39.0–52.0)
Hemoglobin: 13.1 g/dL (ref 13.0–17.0)
MCH: 29.8 pg (ref 26.0–34.0)
MCHC: 32.4 g/dL (ref 30.0–36.0)
MCV: 91.8 fL (ref 80.0–100.0)
Platelets: 281 10*3/uL (ref 150–400)
RBC: 4.4 MIL/uL (ref 4.22–5.81)
RDW: 14.6 % (ref 11.5–15.5)
WBC: 5.2 10*3/uL (ref 4.0–10.5)
nRBC: 0 % (ref 0.0–0.2)

## 2022-01-21 LAB — PROTIME-INR
INR: 1 (ref 0.8–1.2)
Prothrombin Time: 13 seconds (ref 11.4–15.2)

## 2022-01-21 LAB — SARS CORONAVIRUS 2 BY RT PCR: SARS Coronavirus 2 by RT PCR: NEGATIVE

## 2022-01-21 LAB — TROPONIN I (HIGH SENSITIVITY)
Troponin I (High Sensitivity): 26 ng/L — ABNORMAL HIGH (ref <18)
Troponin I (High Sensitivity): 26 ng/L — ABNORMAL HIGH (ref <18)

## 2022-01-21 MED ORDER — IOHEXOL 350 MG/ML SOLN
100.0000 mL | Freq: Once | INTRAVENOUS | Status: AC | PRN
  Administered 2022-01-21: 100 mL via INTRAVENOUS

## 2022-01-21 MED ORDER — HYDROCODONE-ACETAMINOPHEN 5-325 MG PO TABS: 1.0000 | ORAL_TABLET | Freq: Once | ORAL | Status: DC

## 2022-01-21 NOTE — ED Provider Notes (Signed)
[facility] Provider Note    Event Date/Time   First MD Initiated Contact with Patient 01/21/22 1934     (approximate)   History   Chief Complaint: Irregular Heart Beat and Blurred Vision   HPI  Justin Liu. is a 46 y.o. male with a history of hypertension, asthma     Physical Exam   Triage Vital Signs: ED Triage Vitals  Enc Vitals Group     BP 01/21/22 1912 (!) 147/94     Pulse Rate 01/21/22 1912 93     Resp 01/21/22 1912 17     Temp 01/21/22 1912 98.5 F (36.9 C)     Temp Source 01/21/22 1912 Oral     SpO2 01/21/22 1912 99 %     Weight 01/21/22 1915 247 lb (112 kg)     Height 01/21/22 1915 6' (1.829 m)     Head Circumference --      Peak Flow --      Pain Score 01/21/22 1915 7     Pain Loc --      Pain Edu? --      Excl. in GC? --     Most recent vital signs: Vitals:   01/21/22 1912 01/21/22 2109  BP: (!) 147/94 (!) 106/95  Pulse: 93 85  Resp: 17 14  Temp: 98.5 F (36.9 C)   SpO2: 99% 99%    General: Awake, no distress.  CV:  Good peripheral perfusion.  Regular rate and rhythm Resp:  Normal effort.  Clear to auscultation bilaterally Abd:  No distention.  Soft nontender Other:  Normal motor and sensory function.  EOMs intact.  PERRL.  Normal gait, balance, coordination.  Normal speech and language and orientation.  There is focal vision loss of the right eye with intact visual fields.  Right eye is capable of finger counting, seeing outlines, but on measurably poor acuity.  Left eye normal. Funduscopy shows optic disc edema of the right eye, normal optic disc on the left.  No pallor or hemorrhages.  Right fundus vasculature normal and caliber.  Right eye tonometry reveals a pressure of 7.  No inflammatory changes or hypopyon.   ED Results / Procedures / Treatments   Labs (all labs ordered are listed, but only abnormal results are displayed) Labs Reviewed  BASIC METABOLIC PANEL - Abnormal; Notable for the  following components:      Result Value   Glucose, Bld 148 (*)    All other components within normal limits  TROPONIN I (HIGH SENSITIVITY) - Abnormal; Notable for the following components:   Troponin I (High Sensitivity) 26 (*)    All other components within normal limits  TROPONIN I (HIGH SENSITIVITY) - Abnormal; Notable for the following components:   Troponin I (High Sensitivity) 26 (*)    All other components within normal limits  SARS CORONAVIRUS 2 BY RT PCR  CBC  PROTIME-INR  SEDIMENTATION RATE     EKG Interpreted by me Sinus rhythm, rate of 95.  Normal axis, normal intervals.  Poor R wave progression.  Normal ST segments and T waves.   RADIOLOGY CT angiogram head and neck interpreted by me, negative for intracranial hemorrhage or mass.  Radiology report reviewed, no acute findings.   PROCEDURES:  Procedures   MEDICATIONS ORDERED IN ED: Medications  iohexol (OMNIPAQUE) 350 MG/ML injection 100 mL (100 mLs Intravenous Contrast Given 01/21/22 2020)     IMPRESSION / MDM / ASSESSMENT AND PLAN / ED COURSE  I reviewed the triage vital signs and the nursing notes.                              Differential diagnosis includes, but is not limited to, glaucoma, optic neuritis, intracranial mass, cerebral aneurysm, electrolyte abnormality, viral illness  Patient's presentation is most consistent with acute presentation with potential threat to life or bodily function.  Patient presents with monocular vision loss without any other neurologic deficits, localizing to the optic nerve or possible ACA.  No trauma.  Doubt GCA, stroke, encephalitis, meningitis.  Will obtain CT angiogram head and neck, labs.   Clinical Course as of 01/21/22 2308  Wynelle Link Jan 21, 2022  2220 Presentation d/w ophthalmology Dr. 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  Stable for discharge and close follow-up with ophthalmology.   FINAL CLINICAL IMPRESSION(S) / ED DIAGNOSES   Final diagnoses:  Vision loss of right eye     Rx / DC Orders   ED Discharge Orders     None        Note:  This document was prepared using Dragon voice recognition software and may include unintentional dictation errors.   Sharman Cheek, MD 01/21/22 2317

## 2022-01-21 NOTE — ED Triage Notes (Signed)
Patient states he feels like crap. Has been in the bed all day. Patient states about an hour ago he had a sharp pain in his chest, feeling pressure. Patient states he cannot see out of his right eye either. Blurred vision began at 1700. LKW was lastnight. Takes carvedilol, losartan, amlodipine. No blood thinners.

## 2022-01-21 NOTE — Discharge Instructions (Signed)
Go to the [facility] office in [place] at 8:00am tomorrow morning to be seen by Dr. Deberah Pelton. [place] [address] [place], [place] [address]  Phone: [phone] FAX: [phone] [place] Hours Monday, Tuesday and Friday
